# Patient Record
Sex: Male | Born: 1985 | Race: Black or African American | Hispanic: No | Marital: Single | Smoking: Current every day smoker
Health system: Southern US, Community
[De-identification: ages and names within clinical notes are randomized; demographics above are authoritative.]

## PROBLEM LIST (undated history)

## (undated) DIAGNOSIS — N200 Calculus of kidney: Secondary | ICD-10-CM

## (undated) DIAGNOSIS — A879 Viral meningitis, unspecified: Secondary | ICD-10-CM

---

## 2007-12-27 ENCOUNTER — Emergency Department: Payer: Self-pay

## 2008-07-03 ENCOUNTER — Emergency Department: Payer: Self-pay | Admitting: Emergency Medicine

## 2009-09-08 ENCOUNTER — Emergency Department: Payer: Self-pay | Admitting: Unknown Physician Specialty

## 2009-10-30 ENCOUNTER — Emergency Department: Payer: Self-pay | Admitting: Emergency Medicine

## 2010-01-29 ENCOUNTER — Emergency Department: Payer: Self-pay | Admitting: Emergency Medicine

## 2010-02-03 ENCOUNTER — Emergency Department: Payer: Self-pay | Admitting: Emergency Medicine

## 2010-02-07 ENCOUNTER — Ambulatory Visit: Payer: Self-pay | Admitting: Urology

## 2010-06-11 ENCOUNTER — Emergency Department: Payer: Self-pay | Admitting: Emergency Medicine

## 2010-08-04 ENCOUNTER — Emergency Department: Payer: Self-pay | Admitting: Emergency Medicine

## 2010-08-28 ENCOUNTER — Emergency Department: Payer: Self-pay | Admitting: Emergency Medicine

## 2012-08-21 ENCOUNTER — Emergency Department: Payer: Self-pay | Admitting: Emergency Medicine

## 2012-08-21 LAB — CBC
HCT: 45.6 % (ref 40.0–52.0)
HGB: 15.5 g/dL (ref 13.0–18.0)
MCH: 29.1 pg (ref 26.0–34.0)
MCV: 86 fL (ref 80–100)
RDW: 14.6 % — ABNORMAL HIGH (ref 11.5–14.5)
WBC: 8.7 10*3/uL (ref 3.8–10.6)

## 2012-08-21 LAB — BASIC METABOLIC PANEL
Anion Gap: 5 — ABNORMAL LOW (ref 7–16)
Chloride: 102 mmol/L (ref 98–107)
Creatinine: 1.25 mg/dL (ref 0.60–1.30)
EGFR (African American): >60
EGFR (Non-African Amer.): >60
Glucose: 119 mg/dL — ABNORMAL HIGH (ref 65–99)
Osmolality: 276 (ref 275–301)
Potassium: 3.5 mmol/L (ref 3.5–5.1)

## 2012-08-23 ENCOUNTER — Emergency Department: Payer: Self-pay | Admitting: Emergency Medicine

## 2012-08-23 LAB — COMPREHENSIVE METABOLIC PANEL
Albumin: 4.2 g/dL (ref 3.4–5.0)
Alkaline Phosphatase: 45 U/L — ABNORMAL LOW (ref 50–136)
Anion Gap: 3 — ABNORMAL LOW (ref 7–16)
BUN: 9 mg/dL (ref 7–18)
Bilirubin,Total: 0.4 mg/dL (ref 0.2–1.0)
Calcium, Total: 9 mg/dL (ref 8.5–10.1)
Chloride: 105 mmol/L (ref 98–107)
Co2: 30 mmol/L (ref 21–32)
EGFR (African American): >60
EGFR (Non-African Amer.): >60
Glucose: 101 mg/dL — ABNORMAL HIGH (ref 65–99)
Osmolality: 275 (ref 275–301)
Potassium: 3.2 mmol/L — ABNORMAL LOW (ref 3.5–5.1)
Total Protein: 7.3 g/dL (ref 6.4–8.2)

## 2012-08-23 LAB — URINALYSIS, COMPLETE
Bacteria: NONE SEEN
Bilirubin,UR: NEGATIVE
Glucose,UR: NEGATIVE mg/dL (ref 0–75)
Leukocyte Esterase: NEGATIVE
Protein: NEGATIVE
Squamous Epithelial: NONE SEEN
WBC UR: <1 /HPF (ref 0–5)

## 2012-08-23 LAB — CBC
HCT: 43.2 % (ref 40.0–52.0)
HGB: 14.7 g/dL (ref 13.0–18.0)
MCH: 28.6 pg (ref 26.0–34.0)
MCV: 84 fL (ref 80–100)
RBC: 5.14 10*6/uL (ref 4.40–5.90)
RDW: 14.5 % (ref 11.5–14.5)
WBC: 6.7 10*3/uL (ref 3.8–10.6)

## 2012-08-26 ENCOUNTER — Emergency Department: Payer: Self-pay | Admitting: Emergency Medicine

## 2012-08-27 LAB — URINALYSIS, COMPLETE
Glucose,UR: NEGATIVE mg/dL (ref 0–75)
Ketone: NEGATIVE
Nitrite: NEGATIVE
RBC,UR: 299 /HPF (ref 0–5)
WBC UR: 4 /HPF (ref 0–5)

## 2012-12-10 ENCOUNTER — Emergency Department: Payer: Self-pay | Admitting: Emergency Medicine

## 2013-01-05 ENCOUNTER — Emergency Department: Payer: Self-pay | Admitting: Emergency Medicine

## 2013-01-13 ENCOUNTER — Emergency Department: Payer: Self-pay | Admitting: Emergency Medicine

## 2013-04-05 ENCOUNTER — Emergency Department: Payer: Self-pay | Admitting: Emergency Medicine

## 2013-04-05 LAB — URINALYSIS, COMPLETE
BILIRUBIN, UR: NEGATIVE
BLOOD: NEGATIVE
Glucose,UR: NEGATIVE mg/dL (ref 0–75)
KETONE: NEGATIVE
Leukocyte Esterase: NEGATIVE
NITRITE: NEGATIVE
Ph: 6 (ref 4.5–8.0)
Protein: NEGATIVE
Specific Gravity: 1.03 (ref 1.003–1.030)
WBC UR: 1 /HPF (ref 0–5)

## 2013-07-05 ENCOUNTER — Emergency Department: Payer: Self-pay | Admitting: Emergency Medicine

## 2013-07-06 ENCOUNTER — Emergency Department: Payer: Self-pay | Admitting: Emergency Medicine

## 2013-07-06 LAB — URINALYSIS, COMPLETE
BILIRUBIN, UR: NEGATIVE
BLOOD: NEGATIVE
Glucose,UR: NEGATIVE mg/dL (ref 0–75)
Ketone: NEGATIVE
LEUKOCYTE ESTERASE: NEGATIVE
NITRITE: NEGATIVE
PROTEIN: NEGATIVE
Ph: 6 (ref 4.5–8.0)
SPECIFIC GRAVITY: 1.021 (ref 1.003–1.030)
Squamous Epithelial: NONE SEEN
WBC UR: <1 /HPF (ref 0–5)

## 2013-07-07 LAB — URINE CULTURE

## 2013-07-11 ENCOUNTER — Emergency Department: Payer: Self-pay | Admitting: Emergency Medicine

## 2013-07-12 ENCOUNTER — Emergency Department: Payer: Self-pay | Admitting: Emergency Medicine

## 2013-08-24 ENCOUNTER — Emergency Department: Payer: Self-pay | Admitting: Emergency Medicine

## 2013-09-29 ENCOUNTER — Emergency Department: Payer: Self-pay | Admitting: Emergency Medicine

## 2014-02-14 ENCOUNTER — Emergency Department: Payer: Self-pay | Admitting: Emergency Medicine

## 2014-02-14 LAB — URINALYSIS, COMPLETE
BACTERIA: NONE SEEN
BLOOD: NEGATIVE
Bilirubin,UR: NEGATIVE
Glucose,UR: NEGATIVE mg/dL (ref 0–75)
Ketone: NEGATIVE
Leukocyte Esterase: NEGATIVE
Nitrite: NEGATIVE
PH: 7 (ref 4.5–8.0)
Protein: NEGATIVE
RBC,UR: NONE SEEN /HPF (ref 0–5)
Specific Gravity: 1.025 (ref 1.003–1.030)
Squamous Epithelial: NONE SEEN
WBC UR: NONE SEEN /HPF (ref 0–5)

## 2014-02-14 LAB — BASIC METABOLIC PANEL
ANION GAP: 3 — AB (ref 7–16)
BUN: 8 mg/dL (ref 7–18)
Calcium, Total: 8.6 mg/dL (ref 8.5–10.1)
Chloride: 103 mmol/L (ref 98–107)
Co2: 31 mmol/L (ref 21–32)
Creatinine: 1.02 mg/dL (ref 0.60–1.30)
EGFR (African American): >60
EGFR (Non-African Amer.): >60
Glucose: 116 mg/dL — ABNORMAL HIGH (ref 65–99)
Osmolality: 273 (ref 275–301)
Potassium: 3.5 mmol/L (ref 3.5–5.1)
Sodium: 137 mmol/L (ref 136–145)

## 2014-02-14 LAB — HEMOGLOBIN: HGB: 14.6 g/dL (ref 13.0–18.0)

## 2014-02-15 ENCOUNTER — Emergency Department: Payer: Self-pay | Admitting: Emergency Medicine

## 2014-02-15 LAB — ED INFLUENZA
H1N1FLUPCR: NOT DETECTED
INFLBPCR: NEGATIVE
Influenza A By PCR: NEGATIVE

## 2018-04-16 ENCOUNTER — Emergency Department
Admission: EM | Admit: 2018-04-16 | Discharge: 2018-04-16 | Disposition: A | Discharge status: Home / Self Care | Payer: Self-pay | Attending: Emergency Medicine | Admitting: Emergency Medicine

## 2018-04-16 ENCOUNTER — Encounter: Payer: Self-pay | Admitting: Emergency Medicine

## 2018-04-16 ENCOUNTER — Other Ambulatory Visit: Payer: Self-pay

## 2018-04-16 DIAGNOSIS — J029 Acute pharyngitis, unspecified: Secondary | ICD-10-CM | POA: Insufficient documentation

## 2018-04-16 DIAGNOSIS — F17200 Nicotine dependence, unspecified, uncomplicated: Secondary | ICD-10-CM | POA: Insufficient documentation

## 2018-04-16 LAB — GROUP A STREP BY PCR: GROUP A STREP BY PCR: NOT DETECTED

## 2018-04-16 MED ORDER — MAGIC MOUTHWASH W/LIDOCAINE: 5.0000 mL | Freq: Four times a day (QID) | ORAL | 0 refills | Status: DC

## 2018-04-16 NOTE — ED Provider Notes (Signed)
[facility] Emergency Department Provider Note  ____________________________________________  Time seen: Approximately 9:32 PM  I have reviewed the triage vital signs and the nursing notes.   HISTORY  Chief Complaint Sore Throat    HPI Danny Little is a 33 y.o. male who presents the emergency department complaining of sore throat.  Patient reports that he has had a sore throat since yesterday.  He denies any difficulty breathing or swallowing.  No fevers or chills, nasal congestion, cough.  Patient has not been around anybody sick that he knows of.  Patient took some Tylenol Mucinex which he states helped somewhat but then symptoms returned.  Patient denies any other complaints at this time.  No chronic medical problems.         History reviewed. No pertinent past medical history.  There are no active problems to display for this patient.   History reviewed. No pertinent surgical history.  Prior to Admission medications   Medication Sig Start Date End Date Taking? Authorizing Provider  magic mouthwash w/lidocaine SOLN Take 5 mLs by mouth 4 (four) times daily. 04/16/18   Ercell Perlman, Delorise Royals, PA-C    Allergies Patient has no known allergies.  No family history on file.  Social History Social History   Tobacco Use  . Smoking status: Current Every Day Smoker  . Smokeless tobacco: Never Used  Substance Use Topics  . Alcohol use: Not on file  . Drug use: Not on file     Review of Systems  Constitutional: No fever/chills Eyes: No visual changes. No discharge ENT: Positive for sore throat Cardiovascular: no chest pain. Respiratory: no cough. No SOB. Gastrointestinal: No abdominal pain.  No nausea, no vomiting.   Musculoskeletal: Negative for musculoskeletal pain. Skin: Negative for rash, abrasions, lacerations, ecchymosis. Neurological: Negative for headaches, focal weakness or numbness. 10-point ROS otherwise  negative.  ____________________________________________   PHYSICAL EXAM:  VITAL SIGNS: ED Triage Vitals  Enc Vitals Group     BP 04/16/18 1935 128/64     Pulse Rate 04/16/18 1935 63     Resp 04/16/18 1935 18     Temp 04/16/18 1935 98.9 F (37.2 C)     Temp Source 04/16/18 1935 Oral     SpO2 04/16/18 1935 100 %     Weight 04/16/18 1946 192 lb (87.1 kg)     Height 04/16/18 1946 5\' 10"  (1.778 m)     Head Circumference --      Peak Flow --      Pain Score 04/16/18 1946 7     Pain Loc --      Pain Edu? --      Excl. in [place]? --      Constitutional: Alert and oriented. Well appearing and in no acute distress. Eyes: Conjunctivae are normal. PERRL. EOMI. Head: Atraumatic. ENT:      Ears: EACs and TMs unremarkable bilaterally.      Nose: No congestion/rhinnorhea.      Mouth/Throat: Mucous membranes are moist.  Oropharynx is minimally erythematous but there is no edema.  Tonsils are unremarkable bilaterally.  No exudates.  Uvula is midline.  No indication of deep space infection. Neck: No stridor.  Neck is supple full range of motion Hematological/Lymphatic/Immunilogical: No cervical lymphadenopathy. Cardiovascular: Normal rate, regular rhythm. Normal S1 and S2.  Good peripheral circulation. Respiratory: Normal respiratory effort without tachypnea or retractions. Lungs CTAB. Good air entry to the bases with no decreased or absent breath sounds. Musculoskeletal: Full range of motion  to all extremities. No gross deformities appreciated. Neurologic:  Normal speech and language. No gross focal neurologic deficits are appreciated.  Skin:  Skin is warm, dry and intact. No rash noted. Psychiatric: Mood and affect are normal. Speech and behavior are normal. Patient exhibits appropriate insight and judgement.   ____________________________________________   LABS (all labs ordered are listed, but only abnormal results are displayed)  Labs Reviewed  GROUP A STREP BY PCR    ____________________________________________  EKG   ____________________________________________  RADIOLOGY   No results found.  ____________________________________________    PROCEDURES  Procedure(s) performed:    Procedures    Medications - No data to display   ____________________________________________   INITIAL IMPRESSION / ASSESSMENT AND PLAN / ED COURSE  Pertinent labs & imaging results that were available during my care of the patient were reviewed by me and considered in my medical decision making (see chart for details).  Review of the  CSRS was performed in accordance of the [facility] prior to dispensing any controlled drugs.           Patient's diagnosis is consistent with viral pharyngitis.  Patient presented to emergency department with complaint of sore throat.  No accompanying symptoms.  Overall exam is reassuring.  No indication for imaging at this time.  Strep test was negative.  I do not suspect the patient has strep at this time.  Differential includes viral respiratory infection, strep pharyngitis, viral pharyngitis, peritonsillar abscess, retropharyngeal abscess.  There was no indication of deep space infection.  At this time patient will be treated symptomatically with Magic mouthwash.  Tylenol Motrin at home for additional symptom relief.  Follow-up with primary care as needed..  Patient is given ED precautions to return to the ED for any worsening or new symptoms.     ____________________________________________  FINAL CLINICAL IMPRESSION(S) / ED DIAGNOSES  Final diagnoses:  Viral pharyngitis      NEW MEDICATIONS STARTED DURING THIS VISIT:  ED Discharge Orders         Ordered    magic mouthwash w/lidocaine SOLN  4 times daily    Note to Pharmacy:  Dispense in a 1/1/1 ratio. Use lidocaine, diphenhydramine, prednisolone   04/16/18 2143              This chart was dictated using voice recognition software/Dragon.  Despite best efforts to proofread, errors can occur which can change the meaning. Any change was purely unintentional.    Racheal Patches, PA-C 04/16/18 2144    Minna Antis, MD 04/16/18 [phone]

## 2018-04-16 NOTE — ED Triage Notes (Addendum)
Patient ambulatory to triage with steady gait, without difficulty or distress noted, talking on phone during triage, mask in place; pt reports sore throat since yesterday with no accomp symptoms

## 2018-07-12 DIAGNOSIS — S161XXA Strain of muscle, fascia and tendon at neck level, initial encounter: Secondary | ICD-10-CM | POA: Insufficient documentation

## 2018-07-12 DIAGNOSIS — X500XXA Overexertion from strenuous movement or load, initial encounter: Secondary | ICD-10-CM | POA: Insufficient documentation

## 2018-07-12 DIAGNOSIS — Y929 Unspecified place or not applicable: Secondary | ICD-10-CM | POA: Insufficient documentation

## 2018-07-12 DIAGNOSIS — F17210 Nicotine dependence, cigarettes, uncomplicated: Secondary | ICD-10-CM | POA: Insufficient documentation

## 2018-07-12 DIAGNOSIS — R51 Headache: Secondary | ICD-10-CM | POA: Insufficient documentation

## 2018-07-12 DIAGNOSIS — Y999 Unspecified external cause status: Secondary | ICD-10-CM | POA: Insufficient documentation

## 2018-07-12 DIAGNOSIS — Y9389 Activity, other specified: Secondary | ICD-10-CM | POA: Insufficient documentation

## 2018-07-13 ENCOUNTER — Emergency Department (HOSPITAL_COMMUNITY)
Admission: EM | Admit: 2018-07-13 | Discharge: 2018-07-13 | Disposition: A | Discharge status: Home / Self Care | Payer: Self-pay | Attending: Emergency Medicine | Admitting: Emergency Medicine

## 2018-07-13 ENCOUNTER — Emergency Department (HOSPITAL_COMMUNITY): Payer: Self-pay

## 2018-07-13 ENCOUNTER — Encounter (HOSPITAL_COMMUNITY): Payer: Self-pay

## 2018-07-13 DIAGNOSIS — S161XXA Strain of muscle, fascia and tendon at neck level, initial encounter: Secondary | ICD-10-CM

## 2018-07-13 MED ORDER — NAPROXEN 500 MG PO TABS: 500.0000 mg | ORAL_TABLET | Freq: Two times a day (BID) | ORAL | 0 refills | Status: DC | PRN

## 2018-07-13 MED ORDER — METHOCARBAMOL 500 MG PO TABS: 500.0000 mg | ORAL_TABLET | Freq: Two times a day (BID) | ORAL | 0 refills | Status: DC | PRN

## 2018-07-13 NOTE — ED Triage Notes (Signed)
Pt states that he was sitting at home when he started to have sharp pain that radiated up from his neck to his head. Pt has been having headaches for the last couple of days. Pt states that he has hx viral meningitis in 2017, but has not had any fevers at home just headaches. Pt has no neuro deficits in triage at this time is axox4.

## 2018-07-13 NOTE — Discharge Instructions (Addendum)
It was my pleasure taking care of you today!   Fortunately, your CT was normal today.   Naproxen as needed for pain. Robaxin is your muscle relaxer to take as needed.   See information below for primary care doctors in the area.   Return to ER for new or worsening symptoms, any additional concerns.   To find a primary care or specialty doctor please call [phone] or [phone] to access "[facility]."  You may also go on the [facility] website at [email or web address]  There are also multiple [facility] practices throughout the [place] that are frequently accepting new patients. You may find a clinic that is close to your home and contact them.  [place] [address] [phone]  [facility] in [place] (also locations in [place] and [place]) - [place] [phone]  [place] [phone]

## 2018-07-13 NOTE — ED Notes (Signed)
Discharge instructions discussed with pt. Pt verbalized understanding. Pt stable and ambulatory. No signature pad available. 

## 2018-07-13 NOTE — ED Provider Notes (Signed)
[facility] EMERGENCY [facility] Provider Note   CSN: [id number] Arrival date & time: 07/12/18  2357    History   Chief Complaint Chief Complaint  Patient presents with  . Neck Pain    HPI Danny Little is a 33 y.o. male.     The history is provided by the patient and medical records. No language interpreter was used.  Neck Pain Associated symptoms: headaches    Danny Little is a 33 y.o. male who presents to the Emergency Department complaining of left-sided neck pain.  Patient states that he was working on his computer for about an hour when he suddenly had a sharp pain to the left side of his neck going up to his head.  Pain lasted about 5 seconds and then resolved, but has continued to feel very tight.  He turned his head oddly and felt another sharp pain, again lasting about 5 seconds and resolving.  Now the area feels sore.  He also reports having headaches daily, sometimes in the morning, but sometimes early afternoon.  Improves with OTC pain medications.  This is been ongoing for about a week now.  He currently is not having a headache.  Denies any fever or chills.  No visual changes.  No numbness or weakness.  He does report feeling off balance intermittently, feeling as if he is walking as if he is drunk, but he has not.  This last summer between several minutes to an hour and then also will resolve.  He denies any aggravating or alleviating factors with this.  Prior to a week or 2 ago, has never experienced this before.  History reviewed. No pertinent past medical history.  There are no active problems to display for this patient.   History reviewed. No pertinent surgical history.      Home Medications    Prior to Admission medications   Medication Sig Start Date End Date Taking? Authorizing Provider  magic mouthwash w/lidocaine SOLN Take 5 mLs by mouth 4 (four) times daily. 04/16/18   Cuthriell, Delorise RoyalsJonathan D, PA-C  methocarbamol (ROBAXIN)  500 MG tablet Take 1 tablet (500 mg total) by mouth 2 (two) times daily as needed. 07/13/18   Herta Hink, Chase PicketJaime Pilcher, PA-C  naproxen (NAPROSYN) 500 MG tablet Take 1 tablet (500 mg total) by mouth 2 (two) times daily as needed. 07/13/18   Rhett Najera, Chase PicketJaime Pilcher, PA-C    Family History No family history on file.  Social History Social History   Tobacco Use  . Smoking status: Current Every Day Smoker    Packs/day: 1.00  . Smokeless tobacco: Never Used  Substance Use Topics  . Alcohol use: Not on file    Comment: occ  . Drug use: Never     Allergies   Patient has no known allergies.   Review of Systems Review of Systems  Musculoskeletal: Positive for neck pain.  Neurological: Positive for headaches.       Unsteady gait - none currently  All other systems reviewed and are negative.    Physical Exam Updated Vital Signs BP 112/65   Pulse (!) 56   Temp 98.6 F (37 C) (Oral)   Resp 18   Ht 5\' 8"  (1.727 m)   Wt 86.6 kg   SpO2 100%   BMI 29.04 kg/m   Physical Exam Vitals signs and nursing note reviewed.  Constitutional:      General: He is not in acute distress.    Appearance: He is well-developed.  He is not diaphoretic.  HENT:     Head: Normocephalic and atraumatic.  Eyes:     General: No scleral icterus.    Conjunctiva/sclera: Conjunctivae normal.     Pupils: Pupils are equal, round, and reactive to light.     Comments: No nystagmus   Neck:     Musculoskeletal: Normal range of motion and neck supple.     Comments: Full active and passive ROM without pain.  No midline tenderness.  He does have tenderness along the left trapezius musculature. No nuchal rigidity or meningeal signs. Cardiovascular:     Rate and Rhythm: Normal rate and regular rhythm.     Heart sounds: Normal heart sounds.  Pulmonary:     Effort: Pulmonary effort is normal. No respiratory distress.     Breath sounds: Normal breath sounds. No wheezing or rales.  Abdominal:     General: Bowel sounds are  normal. There is no distension.     Palpations: Abdomen is soft.     Tenderness: There is no abdominal tenderness. There is no guarding or rebound.  Musculoskeletal: Normal range of motion.  Lymphadenopathy:     Cervical: No cervical adenopathy.  Skin:    General: Skin is warm and dry.     Findings: No rash.  Neurological:     Mental Status: He is alert and oriented to person, place, and time.     Cranial Nerves: No cranial nerve deficit.     Coordination: Coordination normal.     Deep Tendon Reflexes: Reflexes are normal and symmetric.     Comments: Alert, oriented, thought content appropriate, able to give a coherent history. Speech is clear and goal oriented, able to follow commands.  Cranial Nerves:  II:  Peripheral visual fields grossly normal, pupils equal, round, reactive to light III, IV, VI: EOM intact bilaterally, ptosis not present V,VII: smile symmetric, eyes kept closed tightly against resistance, facial light touch sensation equal VIII: hearing grossly normal IX, X: symmetric soft palate movement, uvula elevates symmetrically  XI: bilateral shoulder shrug symmetric and strong XII: midline tongue extension 5/5 muscle strength in upper and lower extremities bilaterally including strong and equal grip strength and dorsiflexion/plantar flexion Sensory to light touch normal in all four extremities.  Normal finger-to-nose and rapid alternating movements. No drift.  Normal tandem gait.       ED Treatments / Results  Labs (all labs ordered are listed, but only abnormal results are displayed) Labs Reviewed - No data to display  EKG None  Radiology Ct Head Wo Contrast  Result Date: 07/13/2018 CLINICAL DATA:  Headache. EXAM: CT HEAD WITHOUT CONTRAST TECHNIQUE: Contiguous axial images were obtained from the base of the skull through the vertex without intravenous contrast. COMPARISON:  None. FINDINGS: Brain: No intracranial hemorrhage, mass effect, or midline shift. No  hydrocephalus. The basilar cisterns are patent. No evidence of territorial infarct or acute ischemia. No extra-axial or intracranial fluid collection. Vascular: No hyperdense vessel. Skull: No fracture or focal lesion. Sinuses/Orbits: Paranasal sinuses and mastoid air cells are clear. The visualized orbits are unremarkable. Other: None. IMPRESSION: Unremarkable noncontrast head CT. Electronically Signed   By: Keith Rake M.D.   On: 07/13/2018 03:35    Procedures Procedures (including critical care time)  Medications Ordered in ED Medications - No data to display   Initial Impression / Assessment and Plan / ED Course  I have reviewed the triage vital signs and the nursing notes.  Pertinent labs & imaging results that were available  during my care of the patient were reviewed by me and considered in my medical decision making (see chart for details).       Danny Little is a 33 y.o. male who presents to ED for neck pain which appears msk in etiology. No midline tenderness. No nuchal rigidity /meningeal signs.  Afebrile.  Doubt meningitis.  Will treat with naproxen, Robaxin and have him follow-up with PCP.  Patient additionally complaining of intermittent episodes of disequilibrium and daily headaches for the last week.  He denies any current headache, dizziness or difficulty with his gait.  He has a normal neurologic exam including gait.  CT negative. Evaluation does not show pathology that would require ongoing emergent intervention or inpatient treatment.  PCP resources provided.  Reasons to return to the ER were discussed and all questions were answered.  Patient discussed with Dr. Nicanor AlconPalumbo who agrees with treatment plan.    Final Clinical Impressions(s) / ED Diagnoses   Final diagnoses:  Strain of neck muscle, initial encounter    ED Discharge Orders         Ordered    naproxen (NAPROSYN) 500 MG tablet  2 times daily PRN     07/13/18 0350    methocarbamol (ROBAXIN) 500 MG  tablet  2 times daily PRN     07/13/18 0350           Irania Durell, Chase PicketJaime Pilcher, PA-C 07/13/18 0441    Palumbo, April, MD 07/13/18 0532

## 2018-09-19 ENCOUNTER — Encounter: Payer: Self-pay | Admitting: Emergency Medicine

## 2018-09-19 ENCOUNTER — Other Ambulatory Visit: Payer: Self-pay

## 2018-09-19 ENCOUNTER — Emergency Department
Admission: EM | Admit: 2018-09-19 | Discharge: 2018-09-19 | Disposition: A | Discharge status: Home / Self Care | Payer: 59 | Attending: Emergency Medicine | Admitting: Emergency Medicine

## 2018-09-19 ENCOUNTER — Emergency Department: Payer: 59

## 2018-09-19 DIAGNOSIS — M545 Low back pain, unspecified: Secondary | ICD-10-CM

## 2018-09-19 DIAGNOSIS — F17210 Nicotine dependence, cigarettes, uncomplicated: Secondary | ICD-10-CM | POA: Diagnosis not present

## 2018-09-19 HISTORY — DX: Viral meningitis, unspecified: A87.9

## 2018-09-19 HISTORY — DX: Calculus of kidney: N20.0

## 2018-09-19 MED ORDER — METHOCARBAMOL 500 MG PO TABS: 500.0000 mg | ORAL_TABLET | Freq: Three times a day (TID) | ORAL | 0 refills | Status: DC | PRN

## 2018-09-19 MED ORDER — ORPHENADRINE CITRATE ER 100 MG PO TB12: 100.0000 mg | ORAL_TABLET | Freq: Once | ORAL | Status: DC

## 2018-09-19 MED ORDER — ORPHENADRINE CITRATE 30 MG/ML IJ SOLN
60.0000 mg | Freq: Two times a day (BID) | INTRAMUSCULAR | Status: DC
  Administered 2018-09-19: 19:00:00 60 mg via INTRAMUSCULAR
  Filled 2018-09-19: qty 2

## 2018-09-19 MED ORDER — IBUPROFEN 800 MG PO TABS
800.0000 mg | ORAL_TABLET | Freq: Once | ORAL | Status: AC
  Administered 2018-09-19: 800 mg via ORAL
  Filled 2018-09-19: qty 1

## 2018-09-19 MED ORDER — IBUPROFEN 800 MG PO TABS: 800.0000 mg | ORAL_TABLET | Freq: Three times a day (TID) | ORAL | 0 refills | Status: DC | PRN

## 2018-09-19 NOTE — Discharge Instructions (Addendum)
You were seen today for acute low back pain.  The x-ray of your back is negative for any acute findings.  I have given you prescriptions for anti-inflammatories and muscle relaxers to take every 8 hours as needed.  Heat, massage and stretching will also help.  Follow-up if pain persist or worsens.

## 2018-09-19 NOTE — ED Provider Notes (Signed)
[facility] Emergency Department Provider Note ____________________________________________  Time seen: 1824  I have reviewed the triage vital signs and the nursing notes.  HISTORY  Chief Complaint  Back Pain   HPI Danny Little is a 33 y.o. male presents to the ER today with complaint of acute low back pain.  He reports this started 2 days ago.  He describes the pain as sharp and stabbing.  The pain is worse with movement.  The pain does radiate into his right buttock.  He denies numbness, tingling or weakness of the lower extremities.  He denies any urinary or bowel incontinence.  He denies any injury to his back that he is aware of.  He has taken Ibuprofen OTC with minimal relief.  Past Medical History:  Diagnosis Date  . Kidney stones   . Viral meningitis     There are no active problems to display for this patient.   History reviewed. No pertinent surgical history.  Prior to Admission medications   Medication Sig Start Date End Date Taking? Authorizing Provider  ibuprofen (ADVIL) 800 MG tablet Take 1 tablet (800 mg total) by mouth every 8 (eight) hours as needed. 09/19/18   Jearld Fenton, NP  magic mouthwash w/lidocaine SOLN Take 5 mLs by mouth 4 (four) times daily. 04/16/18   Cuthriell, Charline Bills, PA-C  methocarbamol (ROBAXIN) 500 MG tablet Take 1 tablet (500 mg total) by mouth every 8 (eight) hours as needed for muscle spasms. 09/19/18   Jearld Fenton, NP    Allergies Patient has no known allergies.  History reviewed. No pertinent family history.  Social History Social History   Tobacco Use  . Smoking status: Current Every Day Smoker    Packs/day: 1.00  . Smokeless tobacco: Never Used  Substance Use Topics  . Alcohol use: Not on file    Comment: occ  . Drug use: Never    Review of Systems  Constitutional: Negative for fever. Cardiovascular: Negative for chest pain or chest tightness. Respiratory: Negative for cough or  shortness of breath. Gastrointestinal: Negative for bowel incontinence. Genitourinary: Negative for bladder incontinence. Musculoskeletal: Positive for back pain. Skin: Negative for rash. Neurological: Negative for tingling, focal weakness or numbness. ____________________________________________  PHYSICAL EXAM:  VITAL SIGNS: ED Triage Vitals  Enc Vitals Group     BP 09/19/18 1729 119/68     Pulse Rate 09/19/18 1729 71     Resp 09/19/18 1729 18     Temp 09/19/18 1729 98.8 F (37.1 C)     Temp Source 09/19/18 1729 Oral     SpO2 09/19/18 1729 100 %     Weight 09/19/18 1731 194 lb (88 kg)     Height 09/19/18 1731 5\' 9"  (1.753 m)     Head Circumference --      Peak Flow --      Pain Score 09/19/18 1742 8     Pain Loc --      Pain Edu? --      Excl. in [place]? --     Constitutional: Alert and oriented. Well appearing and in no distress. Cardiovascular: Normal rate, regular rhythm.  Pedal pulses 2+ bilaterally Respiratory: Normal respiratory effort. No wheezes/rales/rhonchi. Musculoskeletal: Pain with flexion of the spine.  Normal rotation and extension of the spine.  Bony tenderness noted over the lumbar spine.  Strength 5/5 BLE.  Able to stand on heels and tiptoes.  He has difficulty going from a sitting to a standing position.  Gait  slow and steady without device. Neurologic:  Normal gait without ataxia. Normal speech and language.  Negative SLR on the right.  No gross focal neurologic deficits are appreciated. Skin:  Skin is warm, dry and intact. No rash noted. ___________________________   RADIOLOGY   Imaging Orders     DG Lumbar Spine Complete   IMPRESSION:  No fracture or dislocation of the lumbar spine. Disc spaces and  vertebral body heights are preserved. MRI may be used to further  evaluate lumbar disc and neural foraminal pathology if indicated by  localizing signs and symptoms.    ____________________________________________    INITIAL IMPRESSION / ASSESSMENT  AND PLAN / ED COURSE  Acute Low Back Pain:  Xray lumbar spine Ibuprofen 800 mg PO x 1 in ER Norflex 100 mg PO x 1 in ER Stretching exercises given Heat and massage may be helpful RX for Ibuprofen 800 mg TID prn RX for Methocarbamol 500 mg TID prn- sedation caution given  ____________________________________________  FINAL CLINICAL IMPRESSION(S) / ED DIAGNOSES  Final diagnoses:  Acute midline low back pain without sciatica   Nicki Reaperegina Breia Ocampo, NP    Lorre MunroeBaity, Shakthi Scipio W, NP 09/19/18 [id number]    Dionne BucySiadecki, Sebastian, MD 09/20/18 [phone]

## 2018-09-19 NOTE — ED Notes (Signed)
Pt verbalized understanding of discharge instructions. NAD at this time. 

## 2018-09-19 NOTE — ED Triage Notes (Signed)
Pt presents to ED c/o lower back pain x2 days. Denies known injury. Took ibuprofen yesterday with minimal relief.

## 2019-10-06 ENCOUNTER — Ambulatory Visit (INDEPENDENT_AMBULATORY_CARE_PROVIDER_SITE_OTHER): Payer: 59 | Admitting: Internal Medicine

## 2019-10-06 ENCOUNTER — Other Ambulatory Visit: Payer: Self-pay

## 2019-10-06 ENCOUNTER — Encounter: Payer: Self-pay | Admitting: Internal Medicine

## 2019-10-06 VITALS — BP 125/86 | HR 67 | Ht 69.0 in | Wt 190.1 lb

## 2019-10-06 DIAGNOSIS — A64 Unspecified sexually transmitted disease: Secondary | ICD-10-CM

## 2019-10-06 DIAGNOSIS — Z720 Tobacco use: Secondary | ICD-10-CM | POA: Insufficient documentation

## 2019-10-06 DIAGNOSIS — M545 Low back pain, unspecified: Secondary | ICD-10-CM | POA: Insufficient documentation

## 2019-10-06 DIAGNOSIS — G44219 Episodic tension-type headache, not intractable: Secondary | ICD-10-CM | POA: Insufficient documentation

## 2019-10-06 DIAGNOSIS — R300 Dysuria: Secondary | ICD-10-CM | POA: Insufficient documentation

## 2019-10-06 MED ORDER — ACYCLOVIR 200 MG PO CAPS: 200.0000 mg | ORAL_CAPSULE | Freq: Two times a day (BID) | ORAL | 3 refills | Status: DC

## 2019-10-06 NOTE — Assessment & Plan Note (Signed)
Suggested back exercises as tolerated for acute pain.

## 2019-10-06 NOTE — Assessment & Plan Note (Signed)
-   I instructed the patient to stop smoking and provided them with smoking cessation materials.  - I informed the patient that smoking puts them at increased risk for cancer, COPD, hypertension, and more.  - Informed the patient to seek help if they begin to have trouble breathing, develop chest pain, start to cough up blood, feel faint, or pass out.  

## 2019-10-06 NOTE — Assessment & Plan Note (Signed)
Will check urine. Will also send to health department for STI testing.

## 2019-10-06 NOTE — Assessment & Plan Note (Signed)
Refer to health department.

## 2019-10-06 NOTE — Assessment & Plan Note (Signed)
Pt headache is tension type. He works too hard in the sun. Suggested to use Tylenol as needed.

## 2019-10-06 NOTE — Progress Notes (Signed)
Established Patient Office Visit  SUBJECTIVE:  Subjective  Patient ID: Danny Little, male    DOB: 09-30-1985  Age: 34 y.o. MRN: [id number]  CC:  Chief Complaint  Patient presents with  . Follow-up    Patient states he has no complaints today    HPI Danny Little is a 34 y.o. male presenting today for a routine follow up.  He complains of a headache. He also complains of lower back pain; he notes a history of 2 kidney stones.   He would like to be tested for STIs as a precaution. He notes that he had some mild burning with urination about a week ago.    Past Medical History:  Diagnosis Date  . Kidney stones   . Viral meningitis     History reviewed. No pertinent surgical history.  History reviewed. No pertinent family history.  Social History   Socioeconomic History  . Marital status: Single    Spouse name: Not on file  . Number of children: Not on file  . Years of education: Not on file  . Highest education level: Not on file  Occupational History  . Not on file  Tobacco Use  . Smoking status: Current Every Day Smoker    Packs/day: 0.50    Years: 15.00    Pack years: 7.50  . Smokeless tobacco: Never Used  Substance and Sexual Activity  . Alcohol use: Yes    Alcohol/week: 6.0 standard drinks    Types: 6 Cans of beer per week    Comment: occ, usually on weekends  . Drug use: Never  . Sexual activity: Not on file  Other Topics Concern  . Not on file  Social History Narrative  . Not on file   Social Determinants of Health   Financial Resource Strain:   . Difficulty of Paying Living Expenses: Not on file  Food Insecurity:   . Worried About Programme researcher, broadcasting/film/video in the Last Year: Not on file  . Ran Out of Food in the Last Year: Not on file  Transportation Needs:   . Lack of Transportation (Medical): Not on file  . Lack of Transportation (Non-Medical): Not on file  Physical Activity:   . Days of Exercise per Week: Not on file  . Minutes of  Exercise per Session: Not on file  Stress:   . Feeling of Stress : Not on file  Social Connections:   . Frequency of Communication with Friends and Family: Not on file  . Frequency of Social Gatherings with Friends and Family: Not on file  . Attends Religious Services: Not on file  . Active Member of Clubs or Organizations: Not on file  . Attends Banker Meetings: Not on file  . Marital Status: Not on file  Intimate Partner Violence:   . Fear of Current or Ex-Partner: Not on file  . Emotionally Abused: Not on file  . Physically Abused: Not on file  . Sexually Abused: Not on file     Current Outpatient Medications:  .  acyclovir (ZOVIRAX) 200 MG capsule, Take 1 capsule (200 mg total) by mouth every 12 (twelve) hours., Disp: 60 capsule, Rfl: 3 .  ibuprofen (ADVIL) 800 MG tablet, Take 1 tablet (800 mg total) by mouth every 8 (eight) hours as needed., Disp: 20 tablet, Rfl: 0 .  methocarbamol (ROBAXIN) 500 MG tablet, Take 1 tablet (500 mg total) by mouth every 8 (eight) hours as needed for muscle spasms., Disp: 20 tablet,  Rfl: 0   No Known Allergies  ROS Review of Systems  Constitutional: Negative.   HENT: Negative.   Eyes: Negative.   Respiratory: Negative.   Cardiovascular: Negative.   Gastrointestinal: Negative.   Endocrine: Negative.   Genitourinary: Positive for dysuria (~1 week ago, once). Negative for discharge, penile pain and penile swelling.  Musculoskeletal: Positive for back pain.  Skin: Negative.  Negative for rash.  Allergic/Immunologic: Negative.   Neurological: Negative.   Hematological: Negative.   Psychiatric/Behavioral: Negative.   All other systems reviewed and are negative.    OBJECTIVE:    Physical Exam Vitals reviewed.  Constitutional:      Appearance: Normal appearance.  HENT:     Mouth/Throat:     Mouth: Mucous membranes are moist.  Eyes:     Pupils: Pupils are equal, round, and reactive to light.  Neck:     Vascular: No carotid  bruit.  Cardiovascular:     Rate and Rhythm: Normal rate and regular rhythm.     Pulses: Normal pulses.     Heart sounds: Normal heart sounds.  Pulmonary:     Effort: Pulmonary effort is normal.     Breath sounds: Normal breath sounds.  Abdominal:     General: Bowel sounds are normal.     Palpations: Abdomen is soft. There is no hepatomegaly, splenomegaly or mass.     Tenderness: There is no abdominal tenderness.     Hernia: No hernia is present.  Genitourinary:    Pubic Area: No rash.      Penis: Normal. No erythema, discharge, swelling or lesions.   Musculoskeletal:     Cervical back: Neck supple.     Right lower leg: No edema.     Left lower leg: No edema.  Skin:    Findings: No rash.  Neurological:     Mental Status: He is alert and oriented to person, place, and time.     Motor: No weakness.  Psychiatric:        Mood and Affect: Mood normal.        Behavior: Behavior normal.     BP 125/86   Pulse 67   Ht 5\' 9"  (1.753 m)   Wt 190 lb 1.6 oz (86.2 kg)   BMI 28.07 kg/m  Wt Readings from Last 3 Encounters:  10/06/19 190 lb 1.6 oz (86.2 kg)  09/19/18 194 lb (88 kg)  07/13/18 191 lb (86.6 kg)    Health Maintenance Due  Topic Date Due  . Hepatitis C Screening  Never done  . COVID-19 Vaccine (1) Never done  . HIV Screening  Never done  . TETANUS/TDAP  Never done  . INFLUENZA VACCINE  Never done    There are no preventive care reminders to display for this patient.  CBC Latest Ref Rng & Units 02/14/2014 08/23/2012 08/21/2012  WBC 3.8 - 10.6 x10 3/mm 3 - 6.7 8.7  Hemoglobin 13.0 - 18.0 g/dL 08/23/2012 41.7 40.8  Hematocrit 40.0 - 52.0 % - 43.2 45.6  Platelets 150 - 440 x10 3/mm 3 - 282 327   CMP Latest Ref Rng & Units 02/14/2014 08/23/2012 08/21/2012  Glucose 65 - 99 mg/dL 08/23/2012) 818(H) 631(S)  BUN 7 - 18 mg/dL 8 9 10   Creatinine 0.60 - 1.30 mg/dL 970(Y ) 6.37  Sodium 136 - 145 mmol/L 137 138 138  Potassium 3.5 - 5.1 mmol/L 3.5 3.2(L) 3.5  Chloride 98 - 107 mmol/L  103 105 102  CO2 21 - 32 mmol/L 31  30 31  Calcium 8.5 - 10.1 mg/dL 8.6 9.0 9.5  Total Protein 6.4 - 8.2 g/dL - 7.3 -  Total Bilirubin 0.2 - 1.0 mg/dL - 0.4 -  Alkaline Phos 50 - 136 Unit/L - 45(L) -  AST 15 - 37 Unit/L - 15 -  ALT 12 - 78 U/L - 14 -    No results found for: TSH Lab Results  Component Value Date   ALBUMIN 4.2 08/23/2012   ANIONGAP 3 (L) 02/14/2014   No results found for: CHOL, HDL, LDLCALC, CHOLHDL No results found for: TRIG No results found for: HGBA1C    ASSESSMENT & PLAN:   Problem List Items Addressed This Visit      Nervous and Auditory   Episodic tension-type headache, not intractable    Pt headache is tension type. He works too hard in the sun. Suggested to use Tylenol as needed.         Other   Acute bilateral low back pain without sciatica    Suggested back exercises as tolerated for acute pain.       Dysuria    Will check urine. Will also send to health department for STI testing.       Tobacco abuse    - I instructed the patient to stop smoking and provided them with smoking cessation materials.  - I informed the patient that smoking puts them at increased risk for cancer, COPD, hypertension, and more.  - Informed the patient to seek help if they begin to have trouble breathing, develop chest pain, start to cough up blood, feel faint, or pass out.       STI (sexually transmitted infection) - Primary    Refer to health department.       Relevant Medications   acyclovir (ZOVIRAX) 200 MG capsule      Meds ordered this encounter  Medications  . acyclovir (ZOVIRAX) 200 MG capsule    Sig: Take 1 capsule (200 mg total) by mouth every 12 (twelve) hours.    Dispense:  60 capsule    Refill:  3    Follow-up: Return in about 2 weeks (around 10/20/2019) for Follow Up, test results.    Corky Downs, MD [facility] [address] [place], [place] [address]   By signing my name below, I, [employer], attest that this  documentation has been prepared under the direction and in the presence of Dr. Corky Downs. Electronically Signed: Corky Downs, MD 10/06/19, 11:16 AM   I personally performed the services described in this documentation, which was SCRIBED in my presence. The recorded information has been reviewed and considered accurate. It has been edited as necessary during review. Corky Downs, MD

## 2020-03-05 ENCOUNTER — Other Ambulatory Visit: Payer: Self-pay | Admitting: Internal Medicine

## 2021-01-25 ENCOUNTER — Encounter: Payer: Self-pay | Admitting: Emergency Medicine

## 2021-01-25 ENCOUNTER — Emergency Department
Admission: EM | Admit: 2021-01-25 | Discharge: 2021-01-25 | Disposition: A | Discharge status: Home / Self Care | Payer: Self-pay | Attending: Emergency Medicine | Admitting: Emergency Medicine

## 2021-01-25 ENCOUNTER — Other Ambulatory Visit: Payer: Self-pay

## 2021-01-25 DIAGNOSIS — Z5321 Procedure and treatment not carried out due to patient leaving prior to being seen by health care provider: Secondary | ICD-10-CM | POA: Insufficient documentation

## 2021-01-25 DIAGNOSIS — Z0279 Encounter for issue of other medical certificate: Secondary | ICD-10-CM | POA: Insufficient documentation

## 2021-01-25 NOTE — ED Triage Notes (Signed)
Patient ambulatory to triage with steady gait, without difficulty or distress noted; pt reports +COVID test yesterday and needs a work note

## 2021-02-17 ENCOUNTER — Emergency Department
Admission: EM | Admit: 2021-02-17 | Discharge: 2021-02-17 | Disposition: A | Discharge status: Home / Self Care | Payer: Self-pay | Attending: Emergency Medicine | Admitting: Emergency Medicine

## 2021-02-17 ENCOUNTER — Other Ambulatory Visit: Payer: Self-pay

## 2021-02-17 DIAGNOSIS — T162XXA Foreign body in left ear, initial encounter: Secondary | ICD-10-CM | POA: Insufficient documentation

## 2021-02-17 DIAGNOSIS — X58XXXA Exposure to other specified factors, initial encounter: Secondary | ICD-10-CM | POA: Insufficient documentation

## 2021-02-17 NOTE — ED Provider Notes (Signed)
[facility] Provider Note    Event Date/Time   First MD Initiated Contact with Patient 02/17/21 [phone]     (approximate)   History   Foreign Body in Ear   HPI  Danny Little is a 36 y.o. male complains of foreign body in the left ear.  States has piece of cotton stuck in the left ear canal.  No pain, does have decreased hearing due to foreign body being in the ear.  No fever or chills started after work last night      Physical Exam   Triage Vital Signs: ED Triage Vitals  Enc Vitals Group     BP 02/17/21 0156 138/90     Pulse Rate 02/17/21 0156 88     Resp 02/17/21 0156 20     Temp 02/17/21 0156 98.8 F (37.1 C)     Temp Source 02/17/21 0156 Oral     SpO2 02/17/21 0156 98 %     Weight 02/17/21 0156 190 lb (86.2 kg)     Height 02/17/21 0156 5\' 8"  (1.727 m)     Head Circumference --      Peak Flow --      Pain Score 02/17/21 0159 0     Pain Loc --      Pain Edu? --      Excl. in GC? --     Most recent vital signs: Vitals:   02/17/21 0156 02/17/21 0451  BP: 138/90 128/70  Pulse: 88 62  Resp: 20 16  Temp: 98.8 F (37.1 C)   SpO2: 98% 100%     General: Awake, no distress.   CV:  Good peripheral perfusion. regular rate and  rhythm Resp:  Normal effort.  Abd:  No distention.   Other:  Left ear has a white piece of cotton stuck in the ear canal, after foreign body removal the TM is intact, no bleeding in the ear canal   ED Results / Procedures / Treatments   Labs (all labs ordered are listed, but only abnormal results are displayed) Labs Reviewed - No data to display   EKG     RADIOLOGY     PROCEDURES:   .Foreign Body Removal  Date/Time: 02/17/2021 7:48 AM Performed by: 02/19/2021, PA-C Authorized by: Faythe Ghee, PA-C  Consent: Verbal consent obtained. Risks and benefits: risks, benefits and alternatives were discussed Consent given by: patient Patient understanding: patient states understanding of  the procedure being performed Patient identity confirmed: verbally with patient Body area: ear Patient restrained: no Localization method: ENT speculum Removal mechanism: alligator forceps Post-procedure assessment: foreign body removed Patient tolerance: patient tolerated the procedure well with no immediate complications    MEDICATIONS ORDERED IN ED: Medications - No data to display   IMPRESSION / MDM / ASSESSMENT AND PLAN / ED COURSE  I reviewed the triage vital signs and the nursing notes.                              Differential diagnosis includes, but is not limited to, foreign body, otitis media, cerumen impaction  See procedure note for foreign body removal, was instructed to not use Q-tips.  Return if worsening.  Discharged stable condition peer      FINAL CLINICAL IMPRESSION(S) / ED DIAGNOSES   Final diagnoses:  Ear foreign body, left, initial encounter     Rx / DC Orders   ED Discharge  Orders     None        Note:  This document was prepared using Dragon voice recognition software and may include unintentional dictation errors.    Faythe Ghee, PA-C 02/17/21 1749    Delton Prairie, MD 02/17/21 [phone]

## 2021-02-17 NOTE — ED Triage Notes (Signed)
Pt states qtip stuck in left ear. No distress noted.

## 2021-04-03 ENCOUNTER — Other Ambulatory Visit: Payer: Self-pay | Admitting: Internal Medicine

## 2021-04-21 ENCOUNTER — Other Ambulatory Visit: Payer: Self-pay

## 2021-04-21 ENCOUNTER — Emergency Department: Payer: Self-pay

## 2021-04-21 ENCOUNTER — Emergency Department
Admission: EM | Admit: 2021-04-21 | Discharge: 2021-04-21 | Disposition: A | Discharge status: Home / Self Care | Payer: Self-pay | Attending: Emergency Medicine | Admitting: Emergency Medicine

## 2021-04-21 DIAGNOSIS — L03818 Cellulitis of other sites: Secondary | ICD-10-CM

## 2021-04-21 DIAGNOSIS — N492 Inflammatory disorders of scrotum: Secondary | ICD-10-CM | POA: Insufficient documentation

## 2021-04-21 LAB — CBC WITH DIFFERENTIAL/PLATELET
Abs Immature Granulocytes: 0.02 10*3/uL (ref 0.00–0.07)
Basophils Absolute: 0 10*3/uL (ref 0.0–0.1)
Basophils Relative: 0 %
Eosinophils Absolute: 0.1 10*3/uL (ref 0.0–0.5)
Eosinophils Relative: 2 %
HCT: 42.4 % (ref 39.0–52.0)
Hemoglobin: 14.2 g/dL (ref 13.0–17.0)
Immature Granulocytes: 0 %
Lymphocytes Relative: 19 %
Lymphs Abs: 1.6 10*3/uL (ref 0.7–4.0)
MCH: 30 pg (ref 26.0–34.0)
MCHC: 33.5 g/dL (ref 30.0–36.0)
MCV: 89.6 fL (ref 80.0–100.0)
Monocytes Absolute: 1 10*3/uL (ref 0.1–1.0)
Monocytes Relative: 11 %
Neutro Abs: 5.6 10*3/uL (ref 1.7–7.7)
Neutrophils Relative %: 68 %
Platelets: 375 10*3/uL (ref 150–400)
RBC: 4.73 MIL/uL (ref 4.22–5.81)
RDW: 13.7 % (ref 11.5–15.5)
WBC: 8.3 10*3/uL (ref 4.0–10.5)
nRBC: 0 % (ref 0.0–0.2)

## 2021-04-21 LAB — BASIC METABOLIC PANEL
Anion gap: 6 (ref 5–15)
BUN: 9 mg/dL (ref 6–20)
CO2: 30 mmol/L (ref 22–32)
Calcium: 9.1 mg/dL (ref 8.9–10.3)
Chloride: 102 mmol/L (ref 98–111)
Creatinine, Ser: 0.99 mg/dL (ref 0.61–1.24)
GFR, Estimated: >60 mL/min (ref >60)
Glucose, Bld: 108 mg/dL — ABNORMAL HIGH (ref 70–99)
Potassium: 3.9 mmol/L (ref 3.5–5.1)
Sodium: 138 mmol/L (ref 135–145)

## 2021-04-21 LAB — URINALYSIS, ROUTINE W REFLEX MICROSCOPIC
Bilirubin Urine: NEGATIVE
Glucose, UA: NEGATIVE mg/dL
Hgb urine dipstick: NEGATIVE
Ketones, ur: NEGATIVE mg/dL
Leukocytes,Ua: NEGATIVE
Nitrite: NEGATIVE
Protein, ur: NEGATIVE mg/dL
Specific Gravity, Urine: 1.024 (ref 1.005–1.030)
pH: 6 (ref 5.0–8.0)

## 2021-04-21 MED ORDER — CEPHALEXIN 500 MG PO CAPS: 500.0000 mg | ORAL_CAPSULE | Freq: Four times a day (QID) | ORAL | 0 refills | Status: AC

## 2021-04-21 MED ORDER — DOXYCYCLINE MONOHYDRATE 100 MG PO TABS: 100.0000 mg | ORAL_TABLET | Freq: Two times a day (BID) | ORAL | 0 refills | Status: AC

## 2021-04-21 MED ORDER — OXYCODONE HCL 5 MG PO TABS
10.0000 mg | ORAL_TABLET | Freq: Once | ORAL | Status: AC
  Administered 2021-04-21: 10 mg via ORAL
  Filled 2021-04-21: qty 2

## 2021-04-21 MED ORDER — OXYCODONE-ACETAMINOPHEN 5-325 MG PO TABS: 1.0000 | ORAL_TABLET | Freq: Three times a day (TID) | ORAL | 0 refills | Status: AC | PRN

## 2021-04-21 NOTE — ED Notes (Signed)
Instructed to provide urine sample asap ?

## 2021-04-21 NOTE — ED Notes (Signed)
Patient resting comfortably on stretcher with eyes closed. RR even and unlabored. Patient awoken and asked to try and produce a urine specimen. It has been explained that the provider has asked that I cath him if he is unable to urinate. Patient states he will get up and provide a specimen. Patient has no new complaints. Wife is at bedside. ?

## 2021-04-21 NOTE — Discharge Instructions (Addendum)
-  Take all of your antibiotics as prescribed. ?-Treat pain with Tylenol/ibuprofen as needed.  Utilize oxycodone sparingly. ?-Follow-up with the urologist listed above within 1 week, as discussed. ?-Return to the emergency department anytime if you begin to experience any new or worsening symptoms. ?

## 2021-04-21 NOTE — ED Provider Notes (Signed)
? ?[facility] ?Provider Note ? ? ? Event Date/Time  ? First MD Initiated Contact with Patient 04/21/21 2001   ?  (approximate) ? ? ?History  ? ?Chief Complaint ?Abscess ? ? ?HPI ?Danny Little is a 36 y.o. male, history of tobacco abuse, nephrolithiasis, headaches, low back pain, presents to the emergency department for evaluation of abscess.  Patient states that he pulled an ingrown hair out of his scrotum approximately 2 days ago.  Since then he has noticed some increased swelling and redness along the right scrotal region.  Reports some discharge with squeezing.  Denies fever/chills, urinary symptoms, diarrhea, abdominal pain, penile discharge, headache, chest pain, shortness of breath, or rashes. ? ?History Limitations: No limitations. ? ?  ? ? ?Physical Exam  ?Triage Vital Signs: ?ED Triage Vitals  ?Enc Vitals Group  ?   BP 04/21/21 1956 126/78  ?   Pulse Rate 04/21/21 1956 85  ?   Resp 04/21/21 1956 18  ?   Temp 04/21/21 1956 99.2 ?F (37.3 ?C)  ?   Temp Source 04/21/21 1956 Oral  ?   SpO2 04/21/21 1956 98 %  ?   Weight 04/21/21 1957 183 lb (83 kg)  ?   Height 04/21/21 1957 5\' 8"  (1.727 m)  ?   Head Circumference --   ?   Peak Flow --   ?   Pain Score 04/21/21 1956 7  ?   Pain Loc --   ?   Pain Edu? --   ?   Excl. in GC? --   ? ? ?Most recent vital signs: ?Vitals:  ? 04/21/21 1956 04/21/21 2245  ?BP: 126/78 118/68  ?Pulse: 85 73  ?Resp: 18   ?Temp: 99.2 ?F (37.3 ?C) 98.5 ?F (36.9 ?C)  ?SpO2: 98% 100%  ? ? ?General: Awake, NAD.  ?Skin: Warm, dry.  ?CV: Good peripheral perfusion.  ?Resp: Normal effort.  ?Abd: Soft, non-tender. No distention.  ?Neuro: At baseline. No gross neurological deficits.  ?Other: Approximately 2 cm mass appreciated along the right aspect of the scrotal region, notably tender and erythematous.  White tip at the apex of the mass/abscess.  No tenderness when palpating the testicles.  No obvious urethral discharge.  ? ?Physical Exam ? ? ? ?ED Results / Procedures /  Treatments  ?Labs ?(all labs ordered are listed, but only abnormal results are displayed) ?Labs Reviewed  ?BASIC METABOLIC PANEL - Abnormal; Notable for the following components:  ?    Result Value  ? Glucose, Bld 108 (*)   ? All other components within normal limits  ?URINALYSIS, ROUTINE W REFLEX MICROSCOPIC - Abnormal; Notable for the following components:  ? Color, Urine YELLOW (*)   ? APPearance CLOUDY (*)   ? All other components within normal limits  ?CBC WITH DIFFERENTIAL/PLATELET  ? ? ? ?EKG ?Not applicable. ? ? ?RADIOLOGY ? ?ED Provider Interpretation: I personally viewed these ultrasound images, evidence of scrotal wall thickening suggestive of infection.  No notable abscess. ? ?04/23/21 PELVIS LIMITED (TRANSABDOMINAL ONLY) ? ?Result Date: 04/21/2021 ?CLINICAL DATA:  Right scrotal palpable abnormality for 3 days EXAM: LIMITED ULTRASOUND OF PELVIS TECHNIQUE: Limited transabdominal ultrasound examination of the pelvis was performed. COMPARISON:  None. FINDINGS: Sonographic evaluation of the perineum in right scrotal wall was performed. Right scrotal wall thickening is seen, with 1.4 x 1.5 x 0.7 cm ill-defined hypoechoic area within the subcutaneous tissues consistent with phlegmon. No well-formed abscess at this time. Increased vascularity within the scrotal wall  consistent with infection. IMPRESSION: 1. Scrotal wall thickening and increased vascularity consistent with infection. Organizing phlegmon within the subcutaneous tissues, without well-formed abscess at this time. Electronically Signed   By: Sharlet Salina M.D.   On: 04/21/2021 22:27  ? ?US SCROTUM W/DOPPLER ? ?Result Date: 04/21/2021 ?CLINICAL DATA:  Scrotal pain, abscess EXAM: SCROTAL ULTRASOUND DOPPLER ULTRASOUND OF THE TESTICLES TECHNIQUE: Complete ultrasound examination of the testicles, epididymis, and other scrotal structures was performed. Color and spectral Doppler ultrasound were also utilized to evaluate blood flow to the testicles. COMPARISON:   None. FINDINGS: Right testicle Measurements: 2.8 x 1.6 x 2.5 cm. No mass identified. 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 Left testicle Measurements: 3.2 x 1.5 x 2.3 cm. No mass identified. 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 Right epididymis:  Normal in size and appearance. Left epididymis:  Normal in size and appearance. Hydrocele:  None visualized. Varicocele: There are tortuous vessel seen within the scrotal wall bilaterally, with no significant change in Valsalva to suggest hydrocele. Pulsed Doppler interrogation of both testes demonstrates normal low resistance arterial and venous waveforms bilaterally. IMPRESSION: 1. Bilateral testicular microlithiasis. No evidence of testicular mass. 2. Tortuous vessel seen throughout the scrotal wall, consistent with superficial varicosities. No significant change with Valsalva maneuver to suggest underlying varicocele. 3. No evidence of testicular torsion. Electronically Signed   By: Sharlet Salina M.D.   On: 04/21/2021 21:47   ? ?PROCEDURES: ? ?Critical Care performed: None. ? ?Procedures ? ? ? ?MEDICATIONS ORDERED IN ED: ?Medications  ?oxyCODONE (Oxy IR/ROXICODONE) immediate release tablet 10 mg (10 mg Oral Given 04/21/21 2020)  ? ? ? ?IMPRESSION / MDM / ASSESSMENT AND PLAN / ED COURSE  ?I reviewed the triage vital signs and the nursing notes. ?             ?               ? ? ?Differential diagnosis includes, but is not limited to, scrotal abscess, epididymitis, Fournier's gangrene, testicular torsion, orchitis. ? ?ED Course ?Patient appears well.  Vital signs within normal limits.  Currently endorsing pain at this time.  We will go ahead and treat him with oxycodone. ? ?CBC shows no leukocytosis or anemia.  BMP shows no evidence of electrolyte abnormalities or kidney injury. ? ?Urinalysis shows no evidence of urinary tract infection. ? ?Assessment/Plan ?Presentation consistent with scrotal cellulitis.  No evidence of formed abscess on ultrasound.  Ultrasound reassuring  for no evidence of testicular torsion.  Patient states he is not concerned for STI/STD.  No evidence of systemic infection at this time, though the infection is in a notably sensitive area.  We will cover this patient with cephalexin and doxycycline, as well as provide short-term supply of oxycodone for pain management.  Advised patient to follow-up with urology as well within the next week to ensure improvement of symptoms. ? ?Considered admission for this patient, but given the patient's stable vital signs and reassuring work-up, he is unlikely to benefit. ? ?Patient was provided with anticipatory guidance, return precautions, and educational material. Encouraged the patient to return to the emergency department at any time if they begin to experience any new or worsening symptoms.  ? ?  ? ? ?FINAL CLINICAL IMPRESSION(S) / ED DIAGNOSES  ? ?Final diagnoses:  ?Cellulitis of other specified site  ? ? ? ?Rx / DC Orders  ? ?ED Discharge Orders   ? ?      Ordered  ?  doxycycline (ADOXA) 100 MG tablet  2 times daily       ?  04/21/21 2239  ?  cephALEXin (KEFLEX) 500 MG capsule  4 times daily       ? 04/21/21 2239  ?  oxyCODONE-acetaminophen (PERCOCET/ROXICET) 5-325 MG tablet  Every 8 hours PRN       ? 04/21/21 2239  ? ?  ?  ? ?  ? ? ? ?Note:  This document was prepared using Dragon voice recognition software and may include unintentional dictation errors. ?  ?Varney DailySimpson, Taariq Leitz Lee, [place] ?04/21/21 2304 ? ?  ?Merwyn KatosBradler, Evan K, MD ?04/22/21 1536 ? ?

## 2021-04-21 NOTE — ED Notes (Signed)
Patient requesting something to eat. He has been told that until we get the results of all his tests, he needs to wait on food. Patient is very understanding. ?

## 2021-04-21 NOTE — ED Triage Notes (Signed)
Pt to ED via POV, c/o abscess to scrotal area. Pt states pulled an ingrown hair out x 2 days ago, states since then has had worsening swelling and pain to site.  ?

## 2021-10-18 ENCOUNTER — Other Ambulatory Visit: Payer: Self-pay

## 2021-10-18 ENCOUNTER — Emergency Department
Admission: EM | Admit: 2021-10-18 | Discharge: 2021-10-18 | Discharge status: Against Medical Advice | Payer: Self-pay | Attending: Emergency Medicine | Admitting: Emergency Medicine

## 2021-10-18 DIAGNOSIS — Z5321 Procedure and treatment not carried out due to patient leaving prior to being seen by health care provider: Secondary | ICD-10-CM | POA: Insufficient documentation

## 2021-10-18 DIAGNOSIS — X58XXXA Exposure to other specified factors, initial encounter: Secondary | ICD-10-CM | POA: Insufficient documentation

## 2021-10-18 DIAGNOSIS — S80212A Abrasion, left knee, initial encounter: Secondary | ICD-10-CM | POA: Insufficient documentation

## 2021-10-18 DIAGNOSIS — M25532 Pain in left wrist: Secondary | ICD-10-CM | POA: Insufficient documentation

## 2021-10-18 NOTE — ED Triage Notes (Addendum)
Pt comes with c/o left wrist and hand pain. Pt states he injured it today. Pt also stats abrasion to left knee and pain.

## 2022-02-16 ENCOUNTER — Other Ambulatory Visit: Payer: Self-pay | Admitting: Internal Medicine

## 2022-03-17 ENCOUNTER — Other Ambulatory Visit: Payer: Self-pay

## 2022-03-17 ENCOUNTER — Emergency Department
Admission: EM | Admit: 2022-03-17 | Discharge: 2022-03-17 | Disposition: A | Discharge status: Home / Self Care | Payer: Self-pay | Attending: Emergency Medicine | Admitting: Emergency Medicine

## 2022-03-17 ENCOUNTER — Emergency Department: Payer: Self-pay

## 2022-03-17 DIAGNOSIS — R0602 Shortness of breath: Secondary | ICD-10-CM | POA: Insufficient documentation

## 2022-03-17 DIAGNOSIS — R079 Chest pain, unspecified: Secondary | ICD-10-CM | POA: Insufficient documentation

## 2022-03-17 DIAGNOSIS — R002 Palpitations: Secondary | ICD-10-CM | POA: Insufficient documentation

## 2022-03-17 LAB — BASIC METABOLIC PANEL
Anion gap: 6 (ref 5–15)
BUN: 10 mg/dL (ref 6–20)
CO2: 28 mmol/L (ref 22–32)
Calcium: 9.2 mg/dL (ref 8.9–10.3)
Chloride: 102 mmol/L (ref 98–111)
Creatinine, Ser: 0.96 mg/dL (ref 0.61–1.24)
GFR, Estimated: >60 mL/min (ref >60)
Glucose, Bld: 81 mg/dL (ref 70–99)
Potassium: 3.4 mmol/L — ABNORMAL LOW (ref 3.5–5.1)
Sodium: 136 mmol/L (ref 135–145)

## 2022-03-17 LAB — TROPONIN I (HIGH SENSITIVITY)
Troponin I (High Sensitivity): 4 ng/L (ref <18)
Troponin I (High Sensitivity): 4 ng/L (ref <18)

## 2022-03-17 LAB — CBC
HCT: 43.7 % (ref 39.0–52.0)
Hemoglobin: 14.5 g/dL (ref 13.0–17.0)
MCH: 28.9 pg (ref 26.0–34.0)
MCHC: 33.2 g/dL (ref 30.0–36.0)
MCV: 87.2 fL (ref 80.0–100.0)
Platelets: 369 10*3/uL (ref 150–400)
RBC: 5.01 MIL/uL (ref 4.22–5.81)
RDW: 12.8 % (ref 11.5–15.5)
WBC: 5.6 10*3/uL (ref 4.0–10.5)
nRBC: 0 % (ref 0.0–0.2)

## 2022-03-17 NOTE — ED Notes (Signed)
Pt left without receiving discharge paperwork.

## 2022-03-17 NOTE — ED Provider Notes (Signed)
[facility] Provider Note    Event Date/Time   First MD Initiated Contact with Patient 03/17/22 1130     (approximate)   History   Chief Complaint Chest Pain  HPI  Danny Little is a 37 y.o. male with no significant past medical history who presents to the ED complaining of chest pain.  Patient reports that he was initially awoken from sleep around 1:00 with a sensation that his heart is racing along with sharp pain in his chest and difficulty breathing.  He states he was able to drink some water and calm down, eventually went back to sleep.  He then woke up later this morning with similar palpitations, chest pain, and difficulty breathing.  This again lasted for about an hour before improving, patient now states he is chest pain-free.  He is otherwise felt well recently with no fevers, cough, nausea, vomiting, or diarrhea.  He denies any history of similar symptoms, does report concern that his uncle had a heart attack and died 3 days ago.     Physical Exam   Triage Vital Signs: ED Triage Vitals  Enc Vitals Group     BP 03/17/22 1106 (!) 141/91     Pulse Rate 03/17/22 1106 86     Resp 03/17/22 1106 18     Temp 03/17/22 1106 97.6 F (36.4 C)     Temp Source 03/17/22 1106 Oral     SpO2 03/17/22 1106 100 %     Weight --      Height --      Head Circumference --      Peak Flow --      Pain Score 03/17/22 1103 10     Pain Loc --      Pain Edu? --      Excl. in [place]? --     Most recent vital signs: Vitals:   03/17/22 1106  BP: (!) 141/91  Pulse: 86  Resp: 18  Temp: 97.6 F (36.4 C)  SpO2: 100%    Constitutional: Alert and oriented. Eyes: Conjunctivae are normal. Head: Atraumatic. Nose: No congestion/rhinnorhea. Mouth/Throat: Mucous membranes are moist.  Cardiovascular: Normal rate, regular rhythm. Grossly normal heart sounds.  2+ radial pulses bilaterally. Respiratory: Normal respiratory effort.  No retractions. Lungs  CTAB. Gastrointestinal: Soft and nontender. No distention. Musculoskeletal: No lower extremity tenderness nor edema.  Neurologic:  Normal speech and language. No gross focal neurologic deficits are appreciated.    ED Results / Procedures / Treatments   Labs (all labs ordered are listed, but only abnormal results are displayed) Labs Reviewed  BASIC METABOLIC PANEL - Abnormal; Notable for the following components:      Result Value   Potassium 3.4 (*)    All other components within normal limits  CBC  TROPONIN I (HIGH SENSITIVITY)  TROPONIN I (HIGH SENSITIVITY)     EKG  ED ECG REPORT I, Blake Divine, the attending physician, personally viewed and interpreted this ECG.   Date: 03/17/2022  EKG Time: 11:04  Rate: 86  Rhythm: normal sinus rhythm  Axis: RAD  Intervals:none  ST&T Change: None  RADIOLOGY Chest x-ray reviewed and interpreted by me with no infiltrate, edema, or effusion.  PROCEDURES:  Critical Care performed: No  .1-3 Lead EKG Interpretation  Performed by: Blake Divine, MD Authorized by: Blake Divine, MD     Interpretation: normal     ECG rate:  75-90   ECG rate assessment: normal     Rhythm:  sinus rhythm     Ectopy: none     Conduction: normal      MEDICATIONS ORDERED IN ED: Medications - No data to display   IMPRESSION / MDM / [place] / ED COURSE  I reviewed the triage vital signs and the nursing notes.                              37 y.o. male with no significant past medical history who presents to the ED complaining of intermittent chest pain, palpitations, and difficulty breathing since 1:00 this morning.  Patient's presentation is most consistent with acute presentation with potential threat to life or bodily function.  Differential diagnosis includes, but is not limited to, arrhythmia, ACS, PE, dissection, pneumonia, pneumothorax, musculoskeletal pain, GERD, and anxiety.  Patient well-appearing and in no acute  distress, vital signs are unremarkable.  EKG shows no evidence of arrhythmia or ischemia, initial troponin is negative.  Symptoms are atypical for ACS and I have low suspicion for this, will observe on cardiac monitor and check second set troponin.  Doubt PE or dissection given reassuring vital signs with no active chest pain at this time.  Remainder of labs are reassuring with no significant anemia, leukocytosis, lecture abnormality, or AKI.  Chest x-ray is negative for acute process.  There may be a component of anxiety, especially given the recent death of his family member to cardiac cause.  No events noted on cardiac monitor and repeat troponin is within normal limits.  Patient remains asymptomatic at this time and is appropriate for discharge home, will be provided with referral to establish care with PCP.  He was counseled to return to the ED for new or worsening symptoms, patient agrees with plan.      FINAL CLINICAL IMPRESSION(S) / ED DIAGNOSES   Final diagnoses:  Chest pain, unspecified type  Palpitations     Rx / DC Orders   ED Discharge Orders     None        Note:  This document was prepared using Dragon voice recognition software and may include unintentional dictation errors.   Blake Divine, MD 03/17/22 [id number]

## 2022-03-17 NOTE — ED Triage Notes (Addendum)
Pt to ED via POV from home. Pt reports Sudden onset of centralized CP that started this morning. Pt also reports SOB. Pt denies blood thinner. No known cardiac hx. Pt takes pain radiates to both arms and increased belching and indigestion.

## 2022-08-27 ENCOUNTER — Encounter: Payer: Self-pay | Admitting: Emergency Medicine

## 2022-08-27 ENCOUNTER — Emergency Department
Admission: EM | Admit: 2022-08-27 | Discharge: 2022-08-27 | Disposition: A | Discharge status: Home / Self Care | Payer: BC Managed Care – PPO | Source: Home / Self Care | Attending: Emergency Medicine | Admitting: Emergency Medicine

## 2022-08-27 ENCOUNTER — Emergency Department: Payer: BC Managed Care – PPO

## 2022-08-27 DIAGNOSIS — M549 Dorsalgia, unspecified: Secondary | ICD-10-CM | POA: Diagnosis not present

## 2022-08-27 DIAGNOSIS — Y990 Civilian activity done for income or pay: Secondary | ICD-10-CM | POA: Insufficient documentation

## 2022-08-27 DIAGNOSIS — R109 Unspecified abdominal pain: Secondary | ICD-10-CM | POA: Insufficient documentation

## 2022-08-27 DIAGNOSIS — X500XXA Overexertion from strenuous movement or load, initial encounter: Secondary | ICD-10-CM | POA: Insufficient documentation

## 2022-08-27 LAB — CBC
HCT: 43.2 % (ref 39.0–52.0)
Hemoglobin: 14.2 g/dL (ref 13.0–17.0)
MCH: 29 pg (ref 26.0–34.0)
MCHC: 32.9 g/dL (ref 30.0–36.0)
MCV: 88.3 fL (ref 80.0–100.0)
Platelets: 327 10*3/uL (ref 150–400)
RBC: 4.89 MIL/uL (ref 4.22–5.81)
RDW: 13 % (ref 11.5–15.5)
WBC: 6.5 10*3/uL (ref 4.0–10.5)
nRBC: 0 % (ref 0.0–0.2)

## 2022-08-27 LAB — COMPREHENSIVE METABOLIC PANEL
ALT: 11 U/L (ref 0–44)
AST: 17 U/L (ref 15–41)
Albumin: 4.3 g/dL (ref 3.5–5.0)
Alkaline Phosphatase: 35 U/L — ABNORMAL LOW (ref 38–126)
Anion gap: 7 (ref 5–15)
BUN: 11 mg/dL (ref 6–20)
CO2: 27 mmol/L (ref 22–32)
Calcium: 8.9 mg/dL (ref 8.9–10.3)
Chloride: 103 mmol/L (ref 98–111)
Creatinine, Ser: 1.16 mg/dL (ref 0.61–1.24)
GFR, Estimated: >60 mL/min (ref >60)
Glucose, Bld: 104 mg/dL — ABNORMAL HIGH (ref 70–99)
Potassium: 3.6 mmol/L (ref 3.5–5.1)
Sodium: 137 mmol/L (ref 135–145)
Total Bilirubin: 0.6 mg/dL (ref 0.3–1.2)
Total Protein: 7.2 g/dL (ref 6.5–8.1)

## 2022-08-27 LAB — URINALYSIS, W/ REFLEX TO CULTURE (INFECTION SUSPECTED)
Bilirubin Urine: NEGATIVE
Glucose, UA: NEGATIVE mg/dL
Hgb urine dipstick: NEGATIVE
Ketones, ur: NEGATIVE mg/dL
Leukocytes,Ua: NEGATIVE
Nitrite: NEGATIVE
Protein, ur: NEGATIVE mg/dL
Specific Gravity, Urine: 1.023 (ref 1.005–1.030)
Squamous Epithelial / HPF: NONE SEEN /HPF (ref 0–5)
pH: 6 (ref 5.0–8.0)

## 2022-08-27 LAB — LIPASE, BLOOD: Lipase: 42 U/L (ref 11–51)

## 2022-08-27 MED ORDER — KETOROLAC TROMETHAMINE 30 MG/ML IJ SOLN
30.0000 mg | Freq: Once | INTRAMUSCULAR | Status: AC
  Administered 2022-08-27: 30 mg via INTRAMUSCULAR
  Filled 2022-08-27: qty 1

## 2022-08-27 NOTE — ED Provider Notes (Signed)
[facility] Provider Note    Event Date/Time   First MD Initiated Contact with Patient 08/27/22 1256     (approximate)   History   Spasms and Flank Pain   HPI  Danny Little is a 37 y.o. male past medical history significant for prior kidney stones, presents to the emergency department with right-sided back pain.  States that he has been having a pressure pain from his right back that radiates around to his right hip for the past 3 to 4 days.  Denies association with nausea, vomiting, dysuria, urinary urgency or frequency.  Initially thought that he pulled his back with doing heavy lifting which is required at work.  They became concerned it might be a kidney stone.  Denies any falls or trauma.  Denies any extremity numbness or weakness.  No urinary or bowel incontinence.  Pain does not shoot into his testicles.  Denies any testicular issues or swelling.     Physical Exam   Triage Vital Signs: ED Triage Vitals [08/27/22 1250]  Encounter Vitals Group     BP (!) 128/91     Systolic BP Percentile      Diastolic BP Percentile      Pulse Rate 62     Resp 18     Temp 98.1 F (36.7 C)     Temp Source Oral     SpO2 100 %     Weight      Height      Head Circumference      Peak Flow      Pain Score 6     Pain Loc      Pain Education      Exclude from Growth Chart     Most recent vital signs: Vitals:   08/27/22 1250  BP: (!) 128/91  Pulse: 62  Resp: 18  Temp: 98.1 F (36.7 C)  SpO2: 100%    Physical Exam Constitutional:      Appearance: He is well-developed.  HENT:     Head: Atraumatic.  Eyes:     Conjunctiva/sclera: Conjunctivae normal.  Cardiovascular:     Rate and Rhythm: Regular rhythm.  Pulmonary:     Effort: No respiratory distress.  Abdominal:     General: Bowel sounds are normal.     Tenderness: There is right CVA tenderness.  Musculoskeletal:     Cervical back: Normal range of motion.  Skin:    General: Skin is warm.   Neurological:     Mental Status: He is alert. Mental status is at baseline.      IMPRESSION / MDM / ASSESSMENT AND PLAN / ED COURSE  I reviewed the triage vital signs and the nursing notes.  Differential diagnosis including musculoskeletal, kidney stone, pyelonephritis significant right lower quadrant abdominal tenderness to palpation.  Have low suspicion for acute appendicitis.  No pain in his testicles, clinical picture is not consistent with testicular torsion.  On chart review did have a history of epididymitis.   RADIOLOGY I independently reviewed imaging, my interpretation of imaging: CT scan abdomen and pelvis showed no signs of hydronephrosis and no signs of kidney stones.  Read as no acute findings.   Labs (all labs ordered are listed, but only abnormal results are displayed) Labs interpreted as -    Labs Reviewed  COMPREHENSIVE METABOLIC PANEL - Abnormal; Notable for the following components:      Result Value   Glucose, Bld 104 (*)    Alkaline  Phosphatase 35 (*)    All other components within normal limits  URINALYSIS, W/ REFLEX TO CULTURE (INFECTION SUSPECTED) - Abnormal; Notable for the following components:   Color, Urine YELLOW (*)    APPearance CLOUDY (*)    Bacteria, UA RARE (*)    All other components within normal limits  CBC  LIPASE, BLOOD    Given IM ketorolac for pain control  Labs reassuring, no leukocytosis or anemia.  Creatinine at baseline.  No significant electrolyte abnormalities.  No signs of pancreatitis, lipase normal.  UA with no signs of urinary tract infection.  No blood in the urine.  Likely musculoskeletal.  Did have some improvement of pain.  Discussed NSAIDs and Tylenol for pain control.  Given a referral for primary care physician for follow-up.  Discussed returning to the emergency department for any ongoing or worsening symptoms.     PROCEDURES:  Critical Care performed: No  Procedures  Patient's presentation is most  consistent with acute presentation with potential threat to life or bodily function.   MEDICATIONS ORDERED IN ED: Medications  ketorolac (TORADOL) 30 MG/ML injection 30 mg (30 mg Intramuscular Given 08/27/22 1350)    FINAL CLINICAL IMPRESSION(S) / ED DIAGNOSES   Final diagnoses:  Right flank pain     Rx / DC Orders   ED Discharge Orders          Ordered    Ambulatory Referral to Primary Care (Establish Care)        08/27/22 1422             Note:  This document was prepared using Dragon voice recognition software and may include unintentional dictation errors.   Corena Herter, MD 08/27/22 1510

## 2022-08-27 NOTE — ED Triage Notes (Signed)
Pt endorses right lower back and hip pain for a few days. Denies blood in urine, does report cloudiness. Hx of kidney stones and feels similar.

## 2022-08-27 NOTE — Discharge Instructions (Addendum)
You were seen in the emergency department for right-sided back pain.  You had a CT scan that did not show any findings to cause your pain.  Your lab work was normal.  You did not have any findings of urinary tract infection or any blood in your urine.  Possibly strain from doing heavy lifting at work.  Alternate Motrin and Tylenol.  You can alternate ice and heat.  Increase your oral hydration and drink plenty of fluids.  Do not lift anything heavier than 10 pounds until your back has improved.  Follow-up with your primary care physician.  Return to the emergency department if you have worsening symptoms.  Pain control:  Ibuprofen (motrin/aleve/advil) - You can take 3 tablets (600 mg) every 6 hours as needed for pain/fever.  Acetaminophen (tylenol) - You can take 2 extra strength tablets (1000 mg) every 6 hours as needed for pain/fever.  You can alternate these medications or take them together.  Make sure you eat food/drink water when taking these medications.

## 2022-08-27 NOTE — ED Notes (Signed)
See triage notes. Patient c/o right lower back and hip pain for a few days.

## 2022-08-30 ENCOUNTER — Ambulatory Visit
Admission: EM | Admit: 2022-08-30 | Discharge: 2022-08-30 | Disposition: A | Discharge status: Home / Self Care | Payer: BC Managed Care – PPO | Attending: Physician Assistant | Admitting: Physician Assistant

## 2022-08-30 ENCOUNTER — Encounter: Payer: Self-pay | Admitting: Emergency Medicine

## 2022-08-30 DIAGNOSIS — R21 Rash and other nonspecific skin eruption: Secondary | ICD-10-CM | POA: Diagnosis not present

## 2022-08-30 DIAGNOSIS — W57XXXA Bitten or stung by nonvenomous insect and other nonvenomous arthropods, initial encounter: Secondary | ICD-10-CM

## 2022-08-30 DIAGNOSIS — S20461A Insect bite (nonvenomous) of right back wall of thorax, initial encounter: Secondary | ICD-10-CM

## 2022-08-30 MED ORDER — TRIAMCINOLONE ACETONIDE 0.5 % EX OINT: 1.0000 | TOPICAL_OINTMENT | Freq: Two times a day (BID) | CUTANEOUS | 0 refills | Status: AC

## 2022-08-30 NOTE — ED Provider Notes (Signed)
[facility]    CSN: [id number] Arrival date & time: 08/30/22  0805      History   Chief Complaint Chief Complaint  Patient presents with   Insect Bite    HPI Danny Little is a 37 y.o. male presenting for sudden onset of stinging burning pain and rash over the past 20 to 30 minutes.  Patient reports he was trying to unload some from his truck and felt immediate sharp burning pain which felt with the embers from a cigarette.  He reports his friend looked and saw 2 large red bumps consistent with insect bites.  He says he did not see what could have stung or bitten him.  He came immediately to the urgent care.  He denies any prior major allergic reaction to any insects including bees/ wasps /hornets.  He has not applied ice or taken anything for pain relief or any antihistamines.  He denies any rash elsewhere or associated swelling, breathing difficulty or wheezing.  He did drive himself to the urgent care today.  HPI  Past Medical History:  Diagnosis Date   Kidney stones    Viral meningitis     Patient Active Problem List   Diagnosis Date Noted   Acute bilateral low back pain without sciatica 10/06/2019   Episodic tension-type headache, not intractable 10/06/2019   Dysuria 10/06/2019   Tobacco abuse 10/06/2019   STI (sexually transmitted infection) 10/06/2019    History reviewed. No pertinent surgical history.     Home Medications    Prior to Admission medications   Medication Sig Start Date End Date Taking? Authorizing Provider  triamcinolone ointment (KENALOG) 0.5 % Apply 1 Application topically 2 (two) times daily. 08/30/22  Yes Shirlee Latch, PA-C  acyclovir (ZOVIRAX) 200 MG capsule TAKE 1 CAPSULE (200 MG TOTAL) BY MOUTH EVERY 12 (TWELVE) HOURS. 04/03/21   Corky Downs, MD    Family History History reviewed. No pertinent family history.  Social History Social History   Tobacco Use   Smoking status: Every Day    Current packs/day: 0.50     Average packs/day: 0.5 packs/day for 15.0 years (7.5 ttl pk-yrs)    Types: Cigarettes   Smokeless tobacco: Never  Vaping Use   Vaping status: Never Used  Substance Use Topics   Alcohol use: Yes    Alcohol/week: 6.0 standard drinks of alcohol    Types: 6 Cans of beer per week    Comment: occ, usually on weekends   Drug use: Never     Allergies   Patient has no known allergies.   Review of Systems Review of Systems  Constitutional:  Negative for fatigue.  HENT:  Negative for facial swelling and trouble swallowing.   Respiratory:  Negative for shortness of breath and wheezing.   Gastrointestinal:  Negative for nausea and vomiting.  Skin:  Positive for rash.  Neurological:  Negative for dizziness, weakness and headaches.     Physical Exam Triage Vital Signs ED Triage Vitals  Encounter Vitals Group     BP 08/30/22 0813 136/87     Systolic BP Percentile --      Diastolic BP Percentile --      Pulse Rate 08/30/22 0813 76     Resp 08/30/22 0813 15     Temp 08/30/22 0813 98.6 F (37 C)     Temp Source 08/30/22 0813 Temporal     SpO2 08/30/22 0813 95 %     Weight 08/30/22 0812 187 lb (84.8 kg)  Height 08/30/22 0812 5\' 9"  (1.753 m)     Head Circumference --      Peak Flow --      Pain Score 08/30/22 0812 6     Pain Loc --      Pain Education --      Exclude from Growth Chart --    No data found.  Updated Vital Signs BP 136/87 (BP Location: Left Arm)   Pulse 76   Temp 98.6 F (37 C) (Temporal)   Resp 15   Ht 5\' 9"  (1.753 m)   Wt 187 lb (84.8 kg)   SpO2 95%   BMI 27.62 kg/m    Physical Exam Vitals and nursing note reviewed.  Constitutional:      General: He is not in acute distress.    Appearance: Normal appearance. He is well-developed.  HENT:     Head: Normocephalic and atraumatic.  Eyes:     General: No scleral icterus.    Conjunctiva/sclera: Conjunctivae normal.  Cardiovascular:     Rate and Rhythm: Normal rate and regular rhythm.  Pulmonary:      Effort: Pulmonary effort is normal. No respiratory distress.     Breath sounds: Normal breath sounds.  Musculoskeletal:     Cervical back: Neck supple.  Skin:    General: Skin is warm and dry.     Capillary Refill: Capillary refill takes less than 2 seconds.     Findings: Rash (2 large erythematous wheals of right mid back) present.  Neurological:     General: No focal deficit present.     Mental Status: He is alert. Mental status is at baseline.     Motor: No weakness.     Gait: Gait normal.  Psychiatric:        Mood and Affect: Mood normal.        Behavior: Behavior normal.      UC Treatments / Results  Labs (all labs ordered are listed, but only abnormal results are displayed) Labs Reviewed - No data to display  EKG   Radiology No results found.  Procedures Procedures (including critical care time)  Medications Ordered in UC Medications - No data to display  Initial Impression / Assessment and Plan / UC Course  I have reviewed the triage vital erythematous whealsOn exam he does have 2 largeNo associated facial-year-old signs and the nursing notes.  Pertinent labs & imaging results that were available during my care of the patient were reviewed by me and considered in my medical decision making (see chart for details).   37 y/o male presents for 2 insect bites/rash of right mid back that occurred about 20-30 minutes prior to arrival to urgent care. No associated hives, swelling, throat tightness, SOB. Vitals stable and NAD. On exam he does have 2 large erythematous wheals consistent with insect bites. Appears to be localized reaction. Advised antihistamines, cryotherapy, NSAIDs, tylenol. Sent triamcinolone topical. Reviewed return and ED precautions.   Final Clinical Impressions(s) / UC Diagnoses   Final diagnoses:  Rash and nonspecific skin eruption  Insect bite of right back wall of thorax, initial encounter     Discharge Instructions      -You were  probably stung by bee/wasp/hornet -Ice the area every couple of hours -Apply topical corticosteroid twice daily -Ibuprofen and Tylenol as needed for pain -Antihistamines (claritin, benadryl) for itching/allergic reaction -If severe swelling, spreading rash to other parts of body, breathing trouble call 911 or go to ER   ED Prescriptions  Medication Sig Dispense Auth. Provider   triamcinolone ointment (KENALOG) 0.5 % Apply 1 Application topically 2 (two) times daily. 30 g Gareth Morgan      PDMP not reviewed this encounter.   Shirlee Latch, PA-C 08/30/22 0900

## 2022-08-30 NOTE — ED Triage Notes (Signed)
Patient reports 2 tender swollen bites on the right side of his back this morning.  Patient denies itching.

## 2022-08-30 NOTE — Discharge Instructions (Addendum)
-  You were probably stung by bee/wasp/hornet -Ice the area every couple of hours -Apply topical corticosteroid twice daily -Ibuprofen and Tylenol as needed for pain -Antihistamines (claritin, benadryl) for itching/allergic reaction -If severe swelling, spreading rash to other parts of body, breathing trouble call 911 or go to ER

## 2022-12-01 ENCOUNTER — Emergency Department
Admission: EM | Admit: 2022-12-01 | Discharge: 2022-12-02 | Disposition: A | Discharge status: Home / Self Care | Payer: BC Managed Care – PPO | Attending: Emergency Medicine | Admitting: Emergency Medicine

## 2022-12-01 ENCOUNTER — Other Ambulatory Visit: Payer: Self-pay

## 2022-12-01 DIAGNOSIS — R1084 Generalized abdominal pain: Secondary | ICD-10-CM | POA: Diagnosis present

## 2022-12-01 DIAGNOSIS — K529 Noninfective gastroenteritis and colitis, unspecified: Secondary | ICD-10-CM | POA: Diagnosis not present

## 2022-12-01 LAB — COMPREHENSIVE METABOLIC PANEL
ALT: 12 U/L (ref 0–44)
AST: 17 U/L (ref 15–41)
Albumin: 4.4 g/dL (ref 3.5–5.0)
Alkaline Phosphatase: 37 U/L — ABNORMAL LOW (ref 38–126)
Anion gap: 8 (ref 5–15)
BUN: 9 mg/dL (ref 6–20)
CO2: 25 mmol/L (ref 22–32)
Calcium: 8.8 mg/dL — ABNORMAL LOW (ref 8.9–10.3)
Chloride: 102 mmol/L (ref 98–111)
Creatinine, Ser: 0.98 mg/dL (ref 0.61–1.24)
GFR, Estimated: >60 mL/min (ref >60)
Glucose, Bld: 105 mg/dL — ABNORMAL HIGH (ref 70–99)
Potassium: 3.4 mmol/L — ABNORMAL LOW (ref 3.5–5.1)
Sodium: 135 mmol/L (ref 135–145)
Total Bilirubin: 1 mg/dL (ref 0.3–1.2)
Total Protein: 7.5 g/dL (ref 6.5–8.1)

## 2022-12-01 LAB — CBC
HCT: 45.3 % (ref 39.0–52.0)
Hemoglobin: 15.2 g/dL (ref 13.0–17.0)
MCH: 29.7 pg (ref 26.0–34.0)
MCHC: 33.6 g/dL (ref 30.0–36.0)
MCV: 88.6 fL (ref 80.0–100.0)
Platelets: 310 10*3/uL (ref 150–400)
RBC: 5.11 MIL/uL (ref 4.22–5.81)
RDW: 13.1 % (ref 11.5–15.5)
WBC: 6.4 10*3/uL (ref 4.0–10.5)
nRBC: 0 % (ref 0.0–0.2)

## 2022-12-01 LAB — URINALYSIS, ROUTINE W REFLEX MICROSCOPIC
Bilirubin Urine: NEGATIVE
Glucose, UA: NEGATIVE mg/dL
Hgb urine dipstick: NEGATIVE
Ketones, ur: NEGATIVE mg/dL
Leukocytes,Ua: NEGATIVE
Nitrite: NEGATIVE
Protein, ur: NEGATIVE mg/dL
Specific Gravity, Urine: 1.031 — ABNORMAL HIGH (ref 1.005–1.030)
pH: 5 (ref 5.0–8.0)

## 2022-12-01 LAB — LIPASE, BLOOD: Lipase: 55 U/L — ABNORMAL HIGH (ref 11–51)

## 2022-12-01 MED ORDER — ONDANSETRON HCL 4 MG/2ML IJ SOLN
4.0000 mg | Freq: Once | INTRAMUSCULAR | Status: AC
  Administered 2022-12-01: 4 mg via INTRAVENOUS
  Filled 2022-12-01: qty 2

## 2022-12-01 MED ORDER — MORPHINE SULFATE (PF) 4 MG/ML IV SOLN
4.0000 mg | Freq: Once | INTRAVENOUS | Status: AC
  Administered 2022-12-01: 4 mg via INTRAVENOUS
  Filled 2022-12-01: qty 1

## 2022-12-01 MED ORDER — SODIUM CHLORIDE 0.9 % IV BOLUS (SEPSIS)
1000.0000 mL | Freq: Once | INTRAVENOUS | Status: AC
  Administered 2022-12-01: 1000 mL via INTRAVENOUS

## 2022-12-01 NOTE — ED Provider Notes (Signed)
[facility] Provider Note    Event Date/Time   First MD Initiated Contact with Patient 12/01/22 2327     (approximate)   History   Abdominal Pain   HPI  Danny Little is a 37 y.o. male with history of kidney stones who presents to the emergency department with generalized abdominal pain.  States 2 weeks ago it started off as upper abdominal pain and now he feels abdominal pain in his back and lower abdomen as well.  Has had subjective fevers.  Has had nausea but no vomiting or diarrhea.  No dysuria, hematuria.   History provided by patient, significant other.    Past Medical History:  Diagnosis Date   Kidney stones    Viral meningitis     History reviewed. No pertinent surgical history.  MEDICATIONS:  Prior to Admission medications   Medication Sig Start Date End Date Taking? Authorizing Provider  acyclovir (ZOVIRAX) 200 MG capsule TAKE 1 CAPSULE (200 MG TOTAL) BY MOUTH EVERY 12 (TWELVE) HOURS. 04/03/21   Corky Downs, MD  triamcinolone ointment (KENALOG) 0.5 % Apply 1 Application topically 2 (two) times daily. 08/30/22   Shirlee Latch, PA-C    Physical Exam   Triage Vital Signs: ED Triage Vitals  Encounter Vitals Group     BP 12/01/22 2040 136/82     Systolic BP Percentile --      Diastolic BP Percentile --      Pulse Rate 12/01/22 2040 87     Resp 12/01/22 2040 17     Temp 12/01/22 2040 99.6 F (37.6 C)     Temp Source 12/01/22 2040 Oral     SpO2 12/01/22 2040 98 %     Weight --      Height --      Head Circumference --      Peak Flow --      Pain Score 12/01/22 2041 7     Pain Loc --      Pain Education --      Exclude from Growth Chart --     Most recent vital signs: Vitals:   12/01/22 2040  BP: 136/82  Pulse: 87  Resp: 17  Temp: 99.6 F (37.6 C)  SpO2: 98%    CONSTITUTIONAL: Alert, responds appropriately to questions. Well-appearing; well-nourished HEAD: Normocephalic, atraumatic EYES: Conjunctivae clear,  pupils appear equal, sclera nonicteric ENT: normal nose; moist mucous membranes NECK: Supple, normal ROM CARD: RRR; S1 and S2 appreciated RESP: Normal chest excursion without splinting or tachypnea; breath sounds clear and equal bilaterally; no wheezes, no rhonchi, no rales, no hypoxia or respiratory distress, speaking full sentences ABD/GI: Non-distended; soft, diffusely tender, no rebound, no guarding, no peritoneal signs BACK: The back appears normal EXT: Normal ROM in all joints; no deformity noted, no edema SKIN: Normal color for age and race; warm; no rash on exposed skin NEURO: Moves all extremities equally, normal speech PSYCH: The patient's mood and manner are appropriate.   ED Results / Procedures / Treatments   LABS: (all labs ordered are listed, but only abnormal results are displayed) Labs Reviewed  LIPASE, BLOOD - Abnormal; Notable for the following components:      Result Value   Lipase 55 (*)    All other components within normal limits  COMPREHENSIVE METABOLIC PANEL - Abnormal; Notable for the following components:   Potassium 3.4 (*)    Glucose, Bld 105 (*)    Calcium 8.8 (*)    Alkaline Phosphatase  37 (*)    All other components within normal limits  URINALYSIS, ROUTINE W REFLEX MICROSCOPIC - Abnormal; Notable for the following components:   Color, Urine AMBER (*)    APPearance HAZY (*)    Specific Gravity, Urine 1.031 (*)    All other components within normal limits  CBC     EKG:  RADIOLOGY: My personal review and interpretation of imaging: CT scan shows enteritis.  I have personally reviewed all radiology reports.   CT ABDOMEN PELVIS W CONTRAST  Result Date: 12/02/2022 CLINICAL DATA:  Epigastric pain and constipation. EXAM: CT ABDOMEN AND PELVIS WITH CONTRAST TECHNIQUE: Multidetector CT imaging of the abdomen and pelvis was performed using the standard protocol following bolus administration of intravenous contrast. RADIATION DOSE REDUCTION: This exam  was performed according to the departmental dose-optimization program which includes automated exposure control, adjustment of the mA and/or kV according to patient size and/or use of iterative reconstruction technique. CONTRAST:  OMNIPAQUE IOHEXOL 300 MG/ML  SOLN COMPARISON:  CTs without contrast 08/27/2022 and 08/21/2012. FINDINGS: Lower chest: No acute abnormality. Hepatobiliary: The liver slightly steatotic. There is focal periligamentous fat in segments 3 and 4B. There is no mass enhancement. The gallbladder and bile ducts are unremarkable. Pancreas: No abnormality. Spleen: No abnormality.  No splenomegaly. Adrenals/Urinary Tract: Adrenal glands are unremarkable. Kidneys are normal, without renal calculi, focal lesion, or hydronephrosis. Bladder is contracted and not well seen but there are no inflammatory changes around it. Stomach/Bowel: Stomach is distended with fluid and food products. This was also noted on the 2 prior studies. Query whether this is due to a recent ingestion or impaired gastric emptying. Gastric wall is unremarkable. There are thickened folds in the jejunum and proximal ileum consistent with nonspecific enteritis or enteropathy. There is no small bowel obstruction or inflammatory change. The appendix is normal caliber. There is fluid in the proximal 2/3 of the colon, uncomplicated scattered sigmoid diverticula with no colonic thickening or inflammatory changes. Vascular/Lymphatic: No significant vascular findings are present. No enlarged abdominal or pelvic lymph nodes. Reproductive: Prostate is unremarkable. Other: No abdominal wall hernia or abnormality. No abdominopelvic ascites. Musculoskeletal: Mild symmetric hip DJD, but greater than usually seen at this age. No acute or other significant osseous findings. IMPRESSION: 1. Thickened folds in the jejunum and proximal ileum consistent with nonspecific enteritis or enteropathy. 2. Fluid in the proximal 2/3 of the colon without wall  thickening or inflammatory change. 3. Distended stomach with fluid and food products. Query recent ingestion versus impaired gastric emptying. 4. Slight hepatic steatosis. 5. Sigmoid diverticulosis. 6. Mild symmetric hip DJD. Electronically Signed   By: Almira Bar M.D.   On: 12/02/2022 00:29     PROCEDURES:  Critical Care performed: No     Procedures    IMPRESSION / MDM / ASSESSMENT AND PLAN / ED COURSE  I reviewed the triage vital signs and the nursing notes.    Patient here with abdominal pain, nausea, subjective fever.  The patient is on the cardiac monitor to evaluate for evidence of arrhythmia and/or significant heart rate changes.   DIFFERENTIAL DIAGNOSIS (includes but not limited to):   GERD, gastritis, cholelithiasis, cholecystitis, pancreatitis, appendicitis, colitis, bowel obstruction, UTI, kidney stone   Patient's presentation is most consistent with acute presentation with potential threat to life or bodily function.   PLAN: Labs from triage show no leukocytosis.  Normal electrolytes, renal function, LFTs.  Lipase minimally elevated at 55.  Urine pending.  Will obtain CT of the abdomen  pelvis.  Will give IV fluids, pain and nausea medicine.   MEDICATIONS GIVEN IN ED: Medications  morphine (PF) 4 MG/ML injection 4 mg (4 mg Intravenous Given 12/01/22 2355)  ondansetron (ZOFRAN) injection 4 mg (4 mg Intravenous Given 12/01/22 2354)  sodium chloride 0.9 % bolus 1,000 mL (1,000 mLs Intravenous New Bag/Given 12/01/22 2355)  iohexol (OMNIPAQUE) 300 MG/ML solution 100 mL (100 mLs Intravenous Contrast Given 12/02/22 0006)     ED COURSE: Urine shows no infection or sign of blood.  CT scan reviewed and interpreted by myself and the radiologist and shows enteritis.  Appendix is normal.  There is diverticulosis without diverticulitis.  Suspect symptoms are viral in nature.  No indication for antibiotics today.  Will have him follow-up with gastroenterology if symptoms not  improving.  Recommended bland diet.  Will discharge with Bentyl, Zofran.  He is tolerating p.o. here.   At this time, I do not feel there is any life-threatening condition present. I reviewed all nursing notes, vitals, pertinent previous records.  All lab and urine results, EKGs, imaging ordered have been independently reviewed and interpreted by myself.  I reviewed all available radiology reports from any imaging ordered this visit.  Based on my assessment, I feel the patient is safe to be discharged home without further emergent workup and can continue workup as an outpatient as needed. Discussed all findings, treatment plan as well as usual and customary return precautions.  They verbalize understanding and are comfortable with this plan.  Outpatient follow-up has been provided as needed.  All questions have been answered.]    CONSULTS:  none   OUTSIDE RECORDS REVIEWED: Reviewed last admission to [facility] in 2022 for viral meningitis.       FINAL CLINICAL IMPRESSION(S) / ED DIAGNOSES   Final diagnoses:  Enteritis     Rx / DC Orders   ED Discharge Orders          Ordered    dicyclomine (BENTYL) 20 MG tablet  Every 8 hours PRN        12/02/22 0055    ondansetron (ZOFRAN-ODT) 4 MG disintegrating tablet  Every 6 hours PRN        12/02/22 0055             Note:  This document was prepared using Dragon voice recognition software and may include unintentional dictation errors.   Stevon Gough, Layla Maw, DO 12/02/22 [phone]

## 2022-12-01 NOTE — ED Notes (Signed)
UA cup given to pt for sample collection.

## 2022-12-01 NOTE — ED Triage Notes (Addendum)
Pt c/o upper abdominal/epigastric pain w/ gas, burping, indigestion x2 weeks. Pt denies N/V/D and urinary s/s, reports constipation. Pt AOX4, NAD noted. Abdomen is round and soft. Pt denies eating spicy food and reports he has cut back on breaded and dairy products w/out relief, states he eats a lot of chick fila.

## 2022-12-02 ENCOUNTER — Emergency Department: Payer: BC Managed Care – PPO

## 2022-12-02 MED ORDER — ONDANSETRON 4 MG PO TBDP: 4.0000 mg | ORAL_TABLET | Freq: Four times a day (QID) | ORAL | 0 refills | Status: AC | PRN

## 2022-12-02 MED ORDER — IOHEXOL 300 MG/ML  SOLN
100.0000 mL | Freq: Once | INTRAMUSCULAR | Status: AC | PRN
  Administered 2022-12-02: 100 mL via INTRAVENOUS

## 2022-12-02 MED ORDER — DICYCLOMINE HCL 20 MG PO TABS: 20.0000 mg | ORAL_TABLET | Freq: Three times a day (TID) | ORAL | 0 refills | Status: AC | PRN

## 2022-12-02 NOTE — Discharge Instructions (Addendum)
You may take over-the-counter Tylenol 1000 mg every 6 hours as needed for pain, fever.  I recommend a bland diet for the next several days.  Please follow-up with gastroenterology if symptoms or not improving in the next week.

## 2023-01-02 NOTE — Progress Notes (Unsigned)
Celso Amy, PA-C [address]  [address]  [place], [place] [address]  Main: [phone]  Fax: [phone]   Gastroenterology Consultation  Referring Provider:     No ref. provider found Primary Care Physician:  Pcp, No Primary Gastroenterologist:  *** Reason for Consultation:     ED follow-up generalized abdominal pain.        HPI:   Danny Little is a 38 y.o. y/o male referred for consultation & management  by Pcp, No.    He went to [facility] ED 12/01/2022 with 2-week history of abdominal pain.  Had nausea but no vomiting or diarrhea.  Lipase mildly elevated at 55.  Slightly low potassium 3.4.  UA negative.  Normal white count, hemoglobin, electrolytes, renal function, and LFTs.  Hemoglobin 15.2, WBC 6.4.  Abdominal pelvic CT showed thickening folds in the jejunum and proximal ileum consistent with nonspecific enteritis or enteropathy.  Fluid in the proximal 2/3 of the colon.  No colon thickening.  Distended stomach.  Slight hepatic steatosis.  Diverticulosis but no diverticulitis.  He was diagnosed with viral gastroenteritis.  Treated with Bentyl and Zofran.  Told to follow-up with GI.  Medical history significant for viral meningitis in 2022 which required hospitalization at [facility].  Past Medical History:  Diagnosis Date   Kidney stones    Viral meningitis     No past surgical history on file.  Prior to Admission medications   Medication Sig Start Date End Date Taking? Authorizing Provider  acyclovir (ZOVIRAX) 200 MG capsule TAKE 1 CAPSULE (200 MG TOTAL) BY MOUTH EVERY 12 (TWELVE) HOURS. 04/03/21   Corky Downs, MD  dicyclomine (BENTYL) 20 MG tablet Take 1 tablet (20 mg total) by mouth every 8 (eight) hours as needed. 12/02/22   Ward, Layla Maw, DO  ondansetron (ZOFRAN-ODT) 4 MG disintegrating tablet Take 1 tablet (4 mg total) by mouth every 6 (six) hours as needed for nausea or vomiting. 12/02/22   Ward, Layla Maw, DO  triamcinolone ointment (KENALOG) 0.5 % Apply 1  Application topically 2 (two) times daily. 08/30/22   Shirlee Latch, PA-C    No family history on file.   Social History   Tobacco Use   Smoking status: Every Day    Current packs/day: 0.50    Average packs/day: 0.5 packs/day for 15.0 years (7.5 ttl pk-yrs)    Types: Cigarettes   Smokeless tobacco: Never  Vaping Use   Vaping status: Never Used  Substance Use Topics   Alcohol use: Yes    Alcohol/week: 6.0 standard drinks of alcohol    Types: 6 Cans of beer per week    Comment: occ, usually on weekends   Drug use: Never    Allergies as of 01/03/2023   (No Known Allergies)    Review of Systems:    All systems reviewed and negative except where noted in HPI.   Physical Exam:  There were no vitals taken for this visit. No LMP for male patient.  General:   Alert,  Well-developed, well-nourished, pleasant and cooperative in NAD Lungs:  Respirations even and unlabored.  Clear throughout to auscultation.   No wheezes, crackles, or rhonchi. No acute distress. Heart:  Regular rate and rhythm; no murmurs, clicks, rubs, or gallops. Abdomen:  Normal bowel sounds.  No bruits.  Soft, and non-distended without masses, hepatosplenomegaly or hernias noted.  No Tenderness.  No guarding or rebound tenderness.    Neurologic:  Alert and oriented x3;  grossly normal neurologically. Psych:  Alert and cooperative. Normal mood and affect.  Imaging Studies: No results found.  Assessment and Plan:   Danny Little is a 37 y.o. y/o male has been referred for   1.  ED follow-up of presumed acute viral gastroenteritis  2.  Abnormal CT scan of the small intestine  EGD and colonoscopy if symptomatic  Schedule abdominal pelvic CT enterography?  Fecal calprotectin?  CRP?  Rule out IBD.  3.  Abdominal pain  Repeat CBC and CMP  4.  Mildly elevated lipase  Repeat lipase  Follow up ***  Celso Amy, PA-C    BP check ***

## 2023-01-03 ENCOUNTER — Ambulatory Visit: Payer: BC Managed Care – PPO | Admitting: Physician Assistant

## 2024-02-26 IMAGING — US US PELVIS LIMITED
1 series · 14 of 14 positions shown · non-contrast
Comparison: None.

CLINICAL DATA: Right scrotal palpable abnormality for 3 days

EXAM:
LIMITED ULTRASOUND OF PELVIS
TECHNIQUE: Limited transabdominal ultrasound examination of the pelvis was
performed.

[Series 1: us soft tissue head & neck (non-thyroid) · 14 acquisitions, 14 frames shown]
[im 1/14]
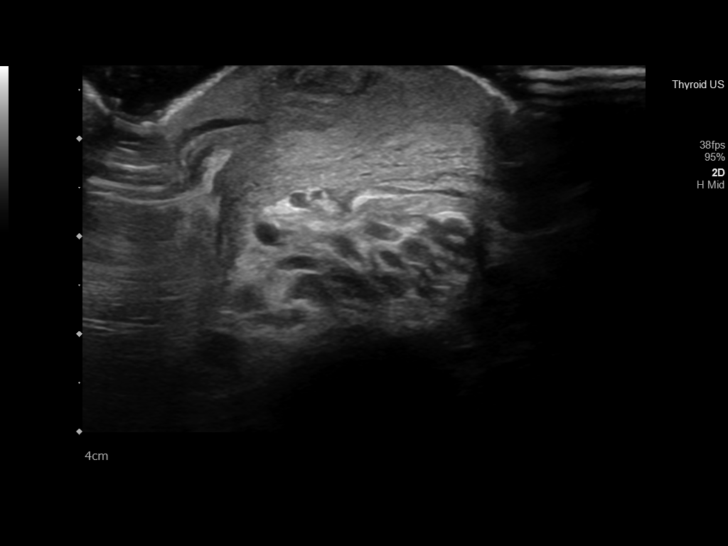
[im 2/14]
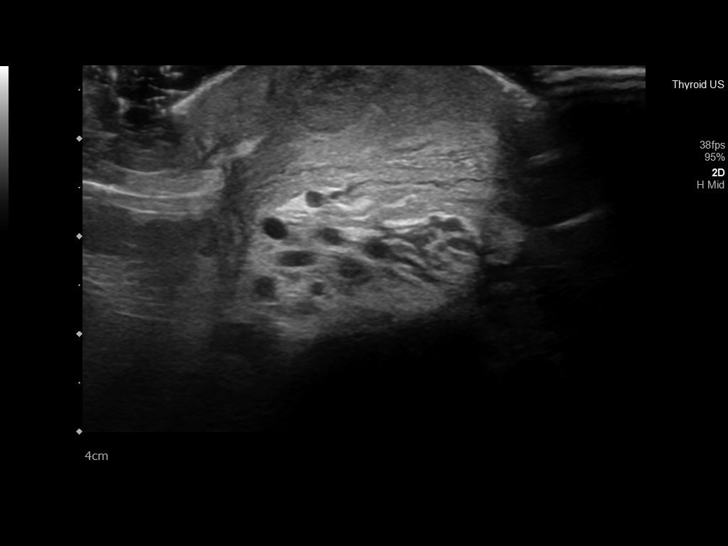
[im 3/14]
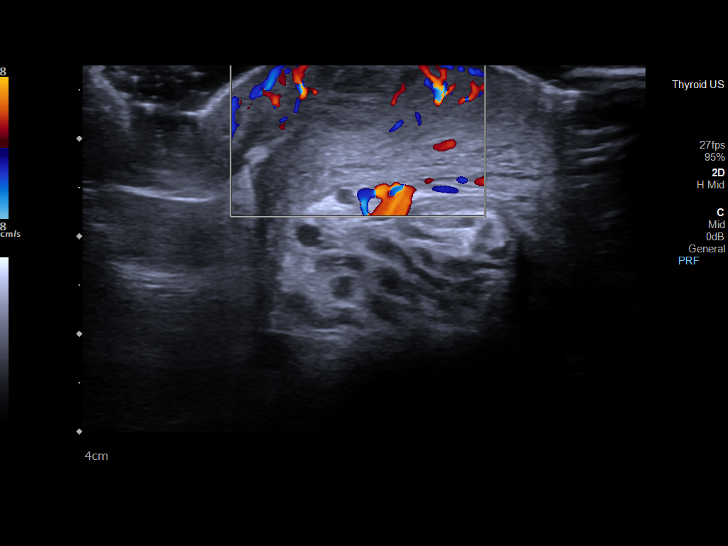
[im 4/14]
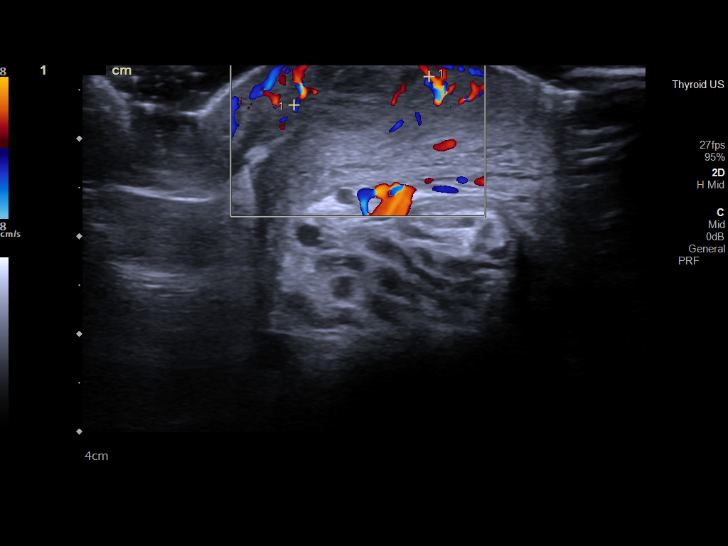
[im 5/14]
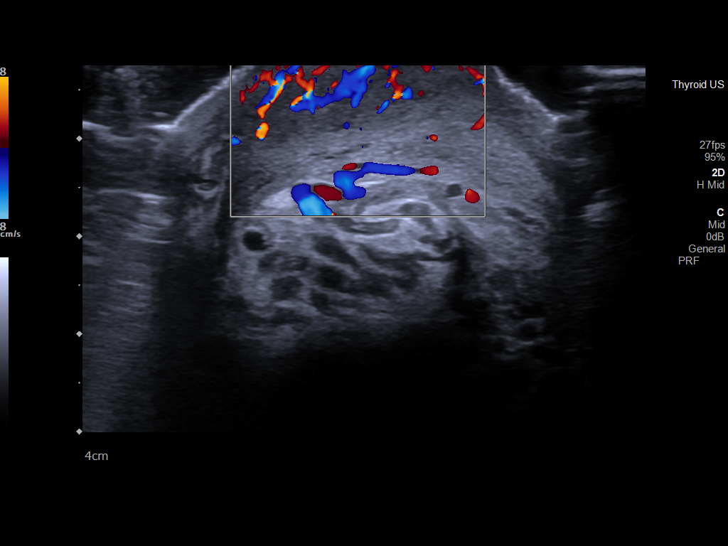
[im 6/14]
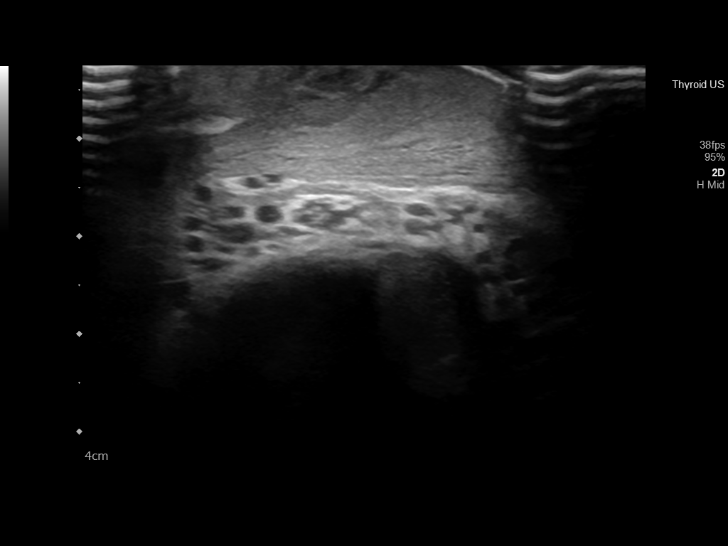
[im 7/14]
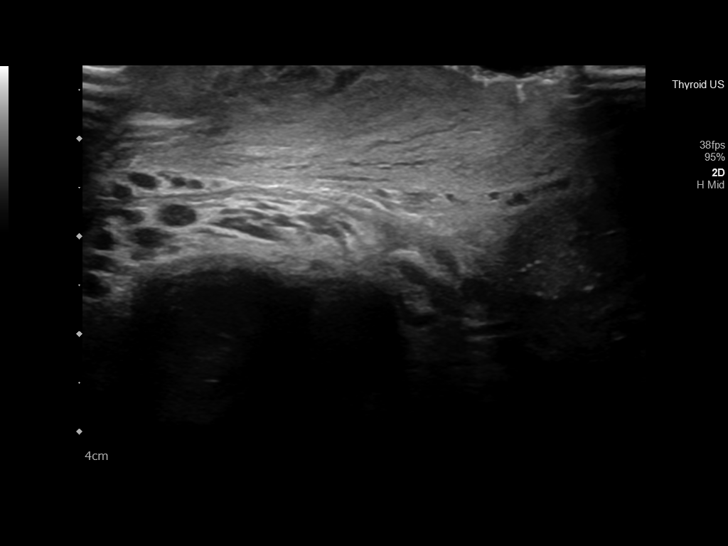
[im 8/14]
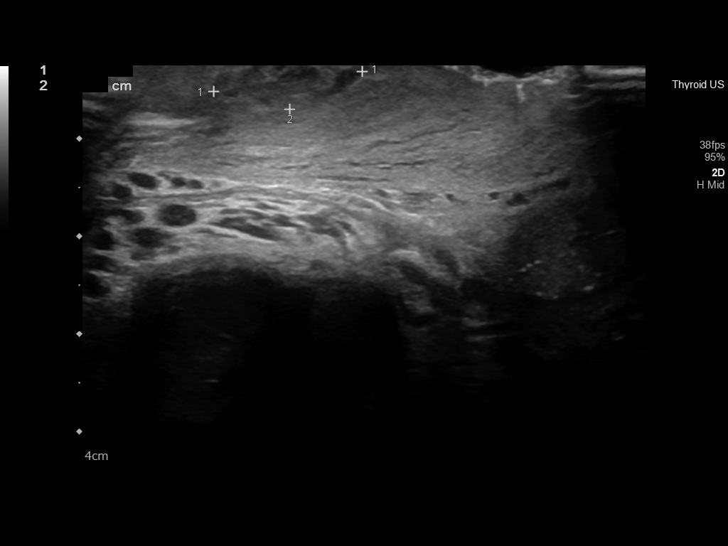
[im 9/14]
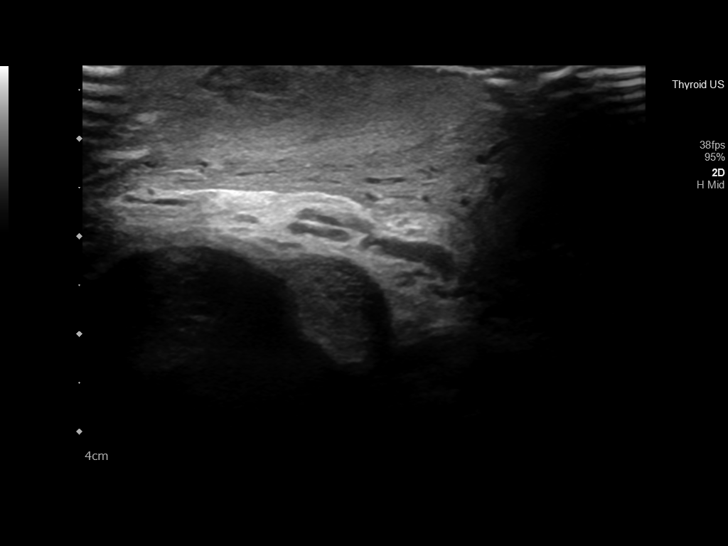
[im 10/14]
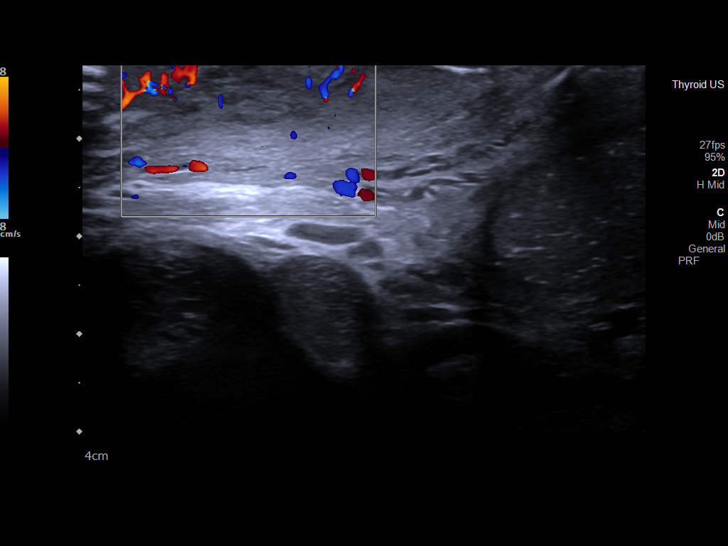
[im 11/14]
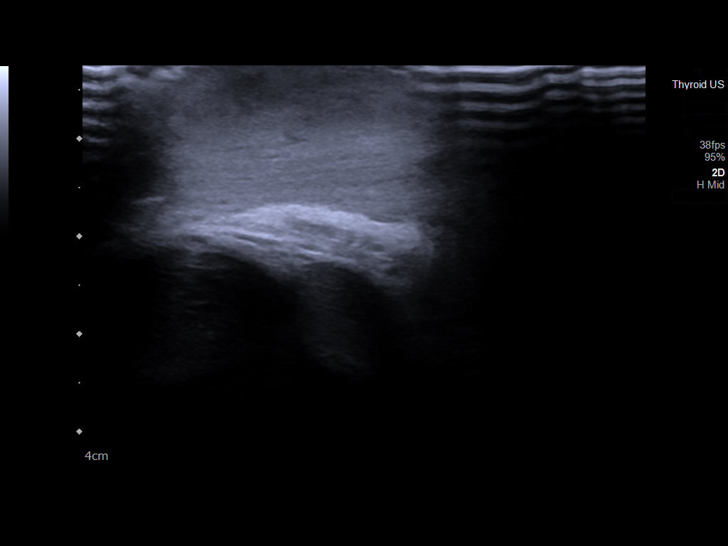
[im 12/14]
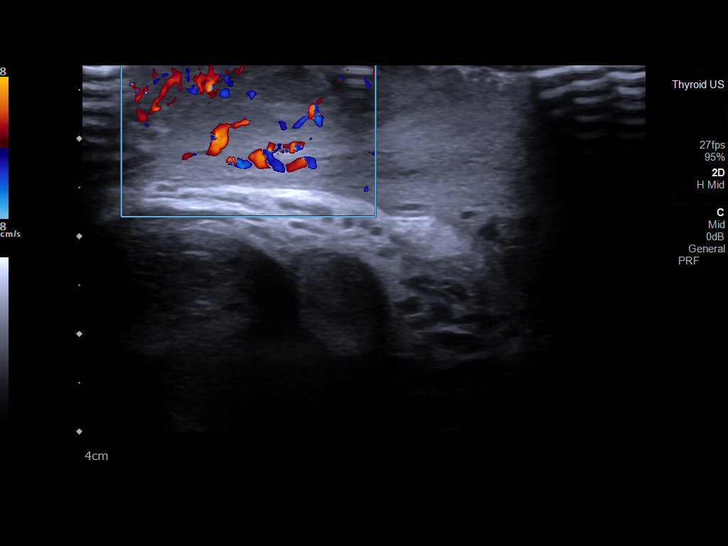
[im 13/14]
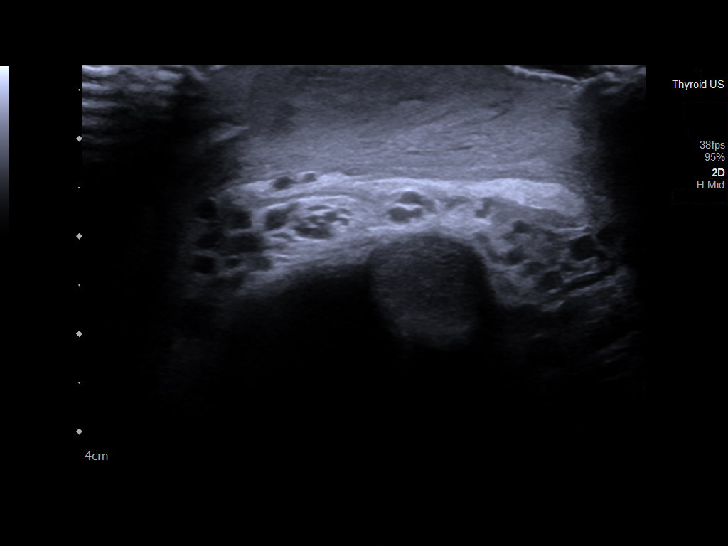
[im 14/14]
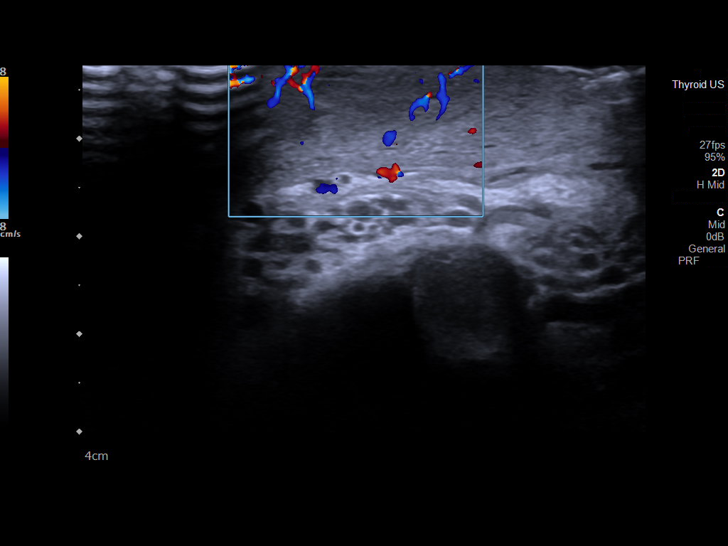

[14 of 14 positions shown; findings below may reference images not displayed]

FINDINGS: Sonographic evaluation of the perineum in right scrotal wall was
performed. Right scrotal wall thickening is seen, with 1.4 x 1.5 x
0.7 cm ill-defined hypoechoic area within the subcutaneous tissues
consistent with phlegmon. No well-formed abscess at this time.
Increased vascularity within the scrotal wall consistent with
infection.
IMPRESSION: 1. Scrotal wall thickening and increased vascularity consistent with
infection. Organizing phlegmon within the subcutaneous tissues,
without well-formed abscess at this time.
